# Patient Record
Sex: Female | Born: 1980 | Race: White | Hispanic: No | Marital: Single | Smoking: Former smoker
Health system: Southern US, Community
[De-identification: ages and names within clinical notes are randomized; demographics above are authoritative.]

## PROBLEM LIST (undated history)

## (undated) DIAGNOSIS — E282 Polycystic ovarian syndrome: Secondary | ICD-10-CM

## (undated) DIAGNOSIS — E0590 Thyrotoxicosis, unspecified without thyrotoxic crisis or storm: Secondary | ICD-10-CM

## (undated) DIAGNOSIS — K219 Gastro-esophageal reflux disease without esophagitis: Secondary | ICD-10-CM

## (undated) DIAGNOSIS — R569 Unspecified convulsions: Secondary | ICD-10-CM

## (undated) DIAGNOSIS — E079 Disorder of thyroid, unspecified: Secondary | ICD-10-CM

## (undated) DIAGNOSIS — R51 Headache: Secondary | ICD-10-CM

## (undated) DIAGNOSIS — F419 Anxiety disorder, unspecified: Secondary | ICD-10-CM

## (undated) HISTORY — DX: Anxiety disorder, unspecified: F41.9

## (undated) HISTORY — DX: Polycystic ovarian syndrome: E28.2

---

## 2001-01-20 ENCOUNTER — Emergency Department (HOSPITAL_COMMUNITY): Admission: EM | Admit: 2001-01-20 | Discharge: 2001-01-20 | Payer: Self-pay | Admitting: *Deleted

## 2001-02-15 HISTORY — PX: KNEE SURGERY: SHX244

## 2001-02-19 ENCOUNTER — Emergency Department (HOSPITAL_COMMUNITY): Admission: EM | Admit: 2001-02-19 | Discharge: 2001-02-19 | Payer: Self-pay | Admitting: Emergency Medicine

## 2001-02-19 ENCOUNTER — Encounter: Payer: Self-pay | Admitting: Emergency Medicine

## 2002-08-15 ENCOUNTER — Emergency Department (HOSPITAL_COMMUNITY): Admission: EM | Admit: 2002-08-15 | Discharge: 2002-08-16 | Payer: Self-pay | Admitting: Emergency Medicine

## 2002-08-16 ENCOUNTER — Encounter: Payer: Self-pay | Admitting: Emergency Medicine

## 2006-10-07 ENCOUNTER — Emergency Department (HOSPITAL_COMMUNITY): Admission: EM | Admit: 2006-10-07 | Discharge: 2006-10-08 | Payer: Self-pay | Admitting: Emergency Medicine

## 2007-03-17 ENCOUNTER — Emergency Department (HOSPITAL_COMMUNITY): Admission: EM | Admit: 2007-03-17 | Discharge: 2007-03-17 | Payer: Self-pay | Admitting: Emergency Medicine

## 2010-08-14 ENCOUNTER — Emergency Department (HOSPITAL_COMMUNITY)
Admission: EM | Admit: 2010-08-14 | Discharge: 2010-08-15 | Disposition: A | Discharge status: Home / Self Care | Payer: Self-pay | Attending: Emergency Medicine | Admitting: Emergency Medicine

## 2010-08-14 DIAGNOSIS — F411 Generalized anxiety disorder: Secondary | ICD-10-CM | POA: Insufficient documentation

## 2010-08-14 DIAGNOSIS — R0682 Tachypnea, not elsewhere classified: Secondary | ICD-10-CM | POA: Insufficient documentation

## 2010-08-14 DIAGNOSIS — R000 Tachycardia, unspecified: Secondary | ICD-10-CM | POA: Insufficient documentation

## 2010-08-14 DIAGNOSIS — F1010 Alcohol abuse, uncomplicated: Secondary | ICD-10-CM | POA: Insufficient documentation

## 2010-08-15 LAB — URINALYSIS, ROUTINE W REFLEX MICROSCOPIC
Bilirubin Urine: NEGATIVE
Glucose, UA: NEGATIVE mg/dL
Leukocytes, UA: NEGATIVE
Nitrite: POSITIVE — AB
Protein, ur: NEGATIVE mg/dL
Specific Gravity, Urine: 1.02 (ref 1.005–1.030)
Urobilinogen, UA: 0.2 mg/dL (ref 0.0–1.0)
pH: 5.5 (ref 5.0–8.0)

## 2010-08-15 LAB — DIFFERENTIAL
Basophils Absolute: 0.1 10*3/uL (ref 0.0–0.1)
Basophils Relative: 1 % (ref 0–1)
Eosinophils Absolute: 0 10*3/uL (ref 0.0–0.7)
Eosinophils Relative: 0 % (ref 0–5)
Lymphocytes Relative: 28 % (ref 12–46)
Lymphs Abs: 2.9 10*3/uL (ref 0.7–4.0)
Monocytes Absolute: 0.5 10*3/uL (ref 0.1–1.0)
Monocytes Relative: 5 % (ref 3–12)
Neutro Abs: 7 10*3/uL (ref 1.7–7.7)
Neutrophils Relative %: 67 % (ref 43–77)

## 2010-08-15 LAB — POCT I-STAT, CHEM 8
BUN: 11 mg/dL (ref 6–23)
Calcium, Ion: 1.01 mmol/L — ABNORMAL LOW (ref 1.12–1.32)
Chloride: 109 mEq/L (ref 96–112)
Creatinine, Ser: 0.8 mg/dL (ref 0.50–1.10)
Glucose, Bld: 107 mg/dL — ABNORMAL HIGH (ref 70–99)
HCT: 39 % (ref 36.0–46.0)
Hemoglobin: 13.3 g/dL (ref 12.0–15.0)
Potassium: 3.3 mEq/L — ABNORMAL LOW (ref 3.5–5.1)
Sodium: 140 mEq/L (ref 135–145)
TCO2: 15 mmol/L (ref 0–100)

## 2010-08-15 LAB — RAPID URINE DRUG SCREEN, HOSP PERFORMED
Amphetamines: NOT DETECTED
Barbiturates: NOT DETECTED
Benzodiazepines: NOT DETECTED
Cocaine: NOT DETECTED
Opiates: NOT DETECTED
Tetrahydrocannabinol: NOT DETECTED

## 2010-08-15 LAB — CBC
HCT: 36.8 % (ref 36.0–46.0)
Hemoglobin: 12.7 g/dL (ref 12.0–15.0)
MCH: 29.2 pg (ref 26.0–34.0)
MCHC: 34.5 g/dL (ref 30.0–36.0)
MCV: 84.6 fL (ref 78.0–100.0)
Platelets: 431 10*3/uL — ABNORMAL HIGH (ref 150–400)
RBC: 4.35 MIL/uL (ref 3.87–5.11)
RDW: 13.3 % (ref 11.5–15.5)
WBC: 10.5 10*3/uL (ref 4.0–10.5)

## 2010-08-15 LAB — URINE MICROSCOPIC-ADD ON

## 2010-08-15 LAB — POCT PREGNANCY, URINE: Preg Test, Ur: NEGATIVE

## 2010-08-15 LAB — ETHANOL: Alcohol, Ethyl (B): 190 mg/dL — ABNORMAL HIGH (ref 0–11)

## 2010-08-15 LAB — ACETAMINOPHEN LEVEL: Acetaminophen (Tylenol), Serum: <15 ug/mL (ref 10–30)

## 2010-08-15 LAB — SALICYLATE LEVEL: Salicylate Lvl: <2 mg/dL — ABNORMAL LOW (ref 2.8–20.0)

## 2010-11-05 LAB — URINALYSIS, ROUTINE W REFLEX MICROSCOPIC
Bilirubin Urine: NEGATIVE
Glucose, UA: NEGATIVE
Ketones, ur: NEGATIVE
Nitrite: NEGATIVE
Protein, ur: NEGATIVE
Specific Gravity, Urine: 1.026
Urobilinogen, UA: 1
pH: 5.5

## 2010-11-05 LAB — I-STAT 8, (EC8 V) (CONVERTED LAB)
Acid-Base Excess: 3 — ABNORMAL HIGH
BUN: 16
Bicarbonate: 27 — ABNORMAL HIGH
Chloride: 107
Glucose, Bld: 94
HCT: 45
Hemoglobin: 15.3 — ABNORMAL HIGH
Operator id: 272551
Potassium: 3.8
Sodium: 141
TCO2: 28
pCO2, Ven: 39.6 — ABNORMAL LOW
pH, Ven: 7.441 — ABNORMAL HIGH

## 2010-11-05 LAB — URINE CULTURE: Colony Count: 35000

## 2010-11-05 LAB — HEPATIC FUNCTION PANEL
ALT: 19
AST: 18
Albumin: 4.1
Alkaline Phosphatase: 62
Bilirubin, Direct: <0.1
Total Bilirubin: 0.4
Total Protein: 7.6

## 2010-11-05 LAB — LIPASE, BLOOD: Lipase: 30

## 2010-11-05 LAB — POCT PREGNANCY, URINE
Operator id: 272551
Preg Test, Ur: NEGATIVE

## 2010-11-05 LAB — POCT I-STAT CREATININE
Creatinine, Ser: 0.9
Operator id: 272551

## 2010-11-05 LAB — URINE MICROSCOPIC-ADD ON

## 2010-11-27 LAB — CBC
HCT: 41.7
Hemoglobin: 14.5
MCHC: 34.8
MCV: 85.5
Platelets: 371
RBC: 4.88
RDW: 13.3
WBC: 14.4 — ABNORMAL HIGH

## 2010-11-27 LAB — COMPREHENSIVE METABOLIC PANEL
ALT: 24
AST: 22
Albumin: 4.1
Alkaline Phosphatase: 63
BUN: 12
CO2: 25
Calcium: 9.5
Chloride: 105
Creatinine, Ser: 0.72
GFR calc Af Amer: >60
GFR calc non Af Amer: >60
Glucose, Bld: 86
Potassium: 4.1
Sodium: 137
Total Bilirubin: 0.7
Total Protein: 7.7

## 2010-11-27 LAB — RAPID URINE DRUG SCREEN, HOSP PERFORMED
Amphetamines: NOT DETECTED
Barbiturates: NOT DETECTED
Benzodiazepines: NOT DETECTED
Cocaine: NOT DETECTED
Opiates: NOT DETECTED
Tetrahydrocannabinol: NOT DETECTED

## 2010-11-27 LAB — DIFFERENTIAL
Basophils Absolute: 0.2 — ABNORMAL HIGH
Basophils Relative: 1
Eosinophils Absolute: 0.2
Eosinophils Relative: 2
Lymphocytes Relative: 21
Lymphs Abs: 3
Monocytes Absolute: 0.6
Monocytes Relative: 4
Neutro Abs: 10.4 — ABNORMAL HIGH
Neutrophils Relative %: 72

## 2011-12-24 ENCOUNTER — Emergency Department (HOSPITAL_COMMUNITY)
Admission: EM | Admit: 2011-12-24 | Discharge: 2011-12-24 | Disposition: A | Discharge status: Home / Self Care | Payer: No Typology Code available for payment source | Attending: Emergency Medicine | Admitting: Emergency Medicine

## 2011-12-24 ENCOUNTER — Emergency Department (HOSPITAL_COMMUNITY): Payer: No Typology Code available for payment source

## 2011-12-24 ENCOUNTER — Encounter (HOSPITAL_COMMUNITY): Payer: Self-pay | Admitting: *Deleted

## 2011-12-24 DIAGNOSIS — S199XXA Unspecified injury of neck, initial encounter: Secondary | ICD-10-CM | POA: Insufficient documentation

## 2011-12-24 DIAGNOSIS — Y929 Unspecified place or not applicable: Secondary | ICD-10-CM | POA: Insufficient documentation

## 2011-12-24 DIAGNOSIS — F17200 Nicotine dependence, unspecified, uncomplicated: Secondary | ICD-10-CM | POA: Insufficient documentation

## 2011-12-24 DIAGNOSIS — R209 Unspecified disturbances of skin sensation: Secondary | ICD-10-CM | POA: Insufficient documentation

## 2011-12-24 DIAGNOSIS — S0993XA Unspecified injury of face, initial encounter: Secondary | ICD-10-CM | POA: Insufficient documentation

## 2011-12-24 DIAGNOSIS — Y9389 Activity, other specified: Secondary | ICD-10-CM | POA: Insufficient documentation

## 2011-12-24 HISTORY — DX: Unspecified convulsions: R56.9

## 2011-12-24 LAB — POCT PREGNANCY, URINE: Preg Test, Ur: NEGATIVE

## 2011-12-24 MED ORDER — IBUPROFEN 800 MG PO TABS
800.0000 mg | ORAL_TABLET | Freq: Once | ORAL | Status: AC
  Administered 2011-12-24: 800 mg via ORAL
  Filled 2011-12-24: qty 1

## 2011-12-24 MED ORDER — DIAZEPAM 5 MG PO TABS: 5.0000 mg | ORAL_TABLET | Freq: Two times a day (BID) | ORAL | Status: DC

## 2011-12-24 MED ORDER — HYDROCODONE-ACETAMINOPHEN 5-325 MG PO TABS: 2.0000 | ORAL_TABLET | Freq: Four times a day (QID) | ORAL | Status: DC | PRN
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
## 2011-12-24 NOTE — ED Notes (Signed)
Pt resting on stretcher, C-Collar remains in place.
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
## 2011-12-28 NOTE — ED Provider Notes (Signed)
Medical screening examination/treatment/procedure(s) were performed by non-physician practitioner and as supervising physician I was immediately available for consultation/collaboration.  Raeford Razor, MD 12/28/11 2342

## 2012-05-21 ENCOUNTER — Ambulatory Visit (INDEPENDENT_AMBULATORY_CARE_PROVIDER_SITE_OTHER): Payer: PRIVATE HEALTH INSURANCE | Admitting: Family Medicine

## 2012-05-21 VITALS — BP 138/88 | HR 103 | Temp 98.4°F | Resp 16 | Ht 64.0 in | Wt 248.0 lb

## 2012-05-21 DIAGNOSIS — M25559 Pain in unspecified hip: Secondary | ICD-10-CM

## 2012-05-21 DIAGNOSIS — N939 Abnormal uterine and vaginal bleeding, unspecified: Secondary | ICD-10-CM

## 2012-05-21 DIAGNOSIS — N898 Other specified noninflammatory disorders of vagina: Secondary | ICD-10-CM

## 2012-05-21 LAB — POCT CBC
Granulocyte percent: 55.8 %G (ref 37–80)
HCT, POC: 43.6 % (ref 37.7–47.9)
Hemoglobin: 13.9 g/dL (ref 12.2–16.2)
Lymph, poc: 4.4 — AB (ref 0.6–3.4)
MCH, POC: 27.1 pg (ref 27–31.2)
MCHC: 31.9 g/dL (ref 31.8–35.4)
MCV: 85.1 fL (ref 80–97)
MID (cbc): 0.9 (ref 0–0.9)
MPV: 9.3 fL (ref 0–99.8)
POC Granulocyte: 6.7 (ref 2–6.9)
POC LYMPH PERCENT: 36.5 %L (ref 10–50)
POC MID %: 7.7 %M (ref 0–12)
Platelet Count, POC: 479 10*3/uL — AB (ref 142–424)
RBC: 5.12 M/uL (ref 4.04–5.48)
RDW, POC: 14.4 %
WBC: 12 10*3/uL — AB (ref 4.6–10.2)

## 2012-05-21 LAB — POCT URINE PREGNANCY: Preg Test, Ur: NEGATIVE

## 2012-05-21 LAB — GLUCOSE, POCT (MANUAL RESULT ENTRY): POC Glucose: 87 mg/dl (ref 70–99)

## 2012-05-21 MED ORDER — MEDROXYPROGESTERONE ACETATE 10 MG PO TABS: 10.0000 mg | ORAL_TABLET | Freq: Every day | ORAL | Status: DC

## 2012-05-21 MED ORDER — HYDROCODONE-IBUPROFEN 7.5-200 MG PO TABS: 1.0000 | ORAL_TABLET | Freq: Every evening | ORAL | Status: DC | PRN

## 2012-05-21 NOTE — Patient Instructions (Addendum)
Polycystic Ovarian Syndrome Polycystic ovarian syndrome is a condition with a number of problems. One problem is with the ovaries. The ovaries are organs located in the female pelvis, on each side of the uterus. Usually, during the menstrual cycle, an egg is released from 1 ovary every month. This is called ovulation. When the egg is fertilized, it goes into the womb (uterus), which allows for the growth of a baby. The egg travels from the ovary through the fallopian tube to the uterus. The ovaries also make the hormones estrogen and progesterone. These hormones help the development of a woman's breasts, body shape, and body hair. They also regulate the menstrual cycle and pregnancy. Sometimes, cysts form in the ovaries. A cyst is a fluid-filled sac. On the ovary, different types of cysts can form. The most common type of ovarian cyst is called a functional or ovulation cyst. It is normal, and often forms during the normal menstrual cycle. Each month, a woman's ovaries grow tiny cysts that hold the eggs. When an egg is fully grown, the sac breaks open. This releases the egg. Then, the sac which released the egg from the ovary dissolves. In one type of functional cyst, called a follicle cyst, the sac does not break open to release the egg. It may actually continue to grow. This type of cyst usually disappears within 1 to 3 months.  One type of cyst problem with the ovaries is called Polycystic Ovarian Syndrome (PCOS). In this condition, many follicle cysts form, but do not rupture and produce an egg. This health problem can affect the following:  Menstrual cycle.  Heart.  Obesity.  Cancer of the uterus.  Fertility.  Blood vessels.  Hair growth (face and body) or baldness.  Hormones.  Appearance.  High blood pressure.  Stroke.  Insulin production.  Inflammation of the liver.  Elevated blood cholesterol and triglycerides. CAUSES   No one knows the exact cause of PCOS.  Women with  PCOS often have a mother or sister with PCOS. There is not yet enough proof to say this is inherited.  Many women with PCOS have a weight problem.  Researchers are looking at the relationship between PCOS and the body's ability to make insulin. Insulin is a hormone that regulates the change of sugar, starches, and other food into energy for the body's use, or for storage. Some women with PCOS make too much insulin. It is possible that the ovaries react by making too many female hormones, called androgens. This can lead to acne, excessive hair growth, weight gain, and ovulation problems.  Too much production of luteinizing hormone (LH) from the pituitary gland in the brain stimulates the ovary to produce too much female hormone (androgen). SYMPTOMS   Infrequent or no menstrual periods, and/or irregular bleeding.  Inability to get pregnant (infertility), because of not ovulating.  Increased growth of hair on the face, chest, stomach, back, thumbs, thighs, or toes.  Acne, oily skin, or dandruff.  Pelvic pain.  Weight gain or obesity, usually carrying extra weight around the waist.  Type 2 diabetes (this is the diabetes that usually does not need insulin).  High cholesterol.  High blood pressure.  Female-pattern baldness or thinning hair.  Patches of thickened and dark brown or black skin on the neck, arms, breasts, or thighs.  Skin tags, or tiny excess flaps of skin, in the armpits or neck area.  Sleep apnea (excessive snoring and breathing stops at times while asleep).  Deepening of the voice.    Gestational diabetes when pregnant.  Increased risk of miscarriage with pregnancy. DIAGNOSIS  There is no single test to diagnose PCOS.   Your caregiver will:  Take a medical history.  Perform a pelvic exam.  Perform an ultrasound.  Check your female and female hormone levels.  Measure glucose or sugar levels in the blood.  Do other blood tests.  If you are producing too many  female hormones, your caregiver will make sure it is from PCOS. At the physical exam, your caregiver will want to evaluate the areas of increased hair growth. Try to allow natural hair growth for a few days before the visit.  During a pelvic exam, the ovaries may be enlarged or swollen by the increased number of small cysts. This can be seen more easily by vaginal ultrasound or screening, to examine the ovaries and lining of the uterus (endometrium) for cysts. The uterine lining may become thicker, if there has not been a regular period. TREATMENT  Because there is no cure for PCOS, it needs to be managed to prevent problems. Treatments are based on your symptoms. Treatment is also based on whether you want to have a baby or whether you need contraception.  Treatment may include:  Progesterone hormone, to start a menstrual period.  Birth control pills, to make you have regular menstrual periods.  Medicines to make you ovulate, if you want to get pregnant.  Medicines to control your insulin.  Medicine to control your blood pressure.  Medicine and diet, to control your high cholesterol and triglycerides in your blood.  Surgery, making small holes in the ovary, to decrease the amount of female hormone production. This is done through a long, lighted tube (laparoscope), placed into the pelvis through a tiny incision in the lower abdomen. Your caregiver will go over some of the choices with you. WOMEN WITH PCOS HAVE THESE CHARACTERISTICS:  High levels of female hormones called androgens.  An irregular or no menstrual cycle.  May have many small cysts in their ovaries. PCOS is the most common hormonal reproductive problem in women of childbearing age. WHY DO WOMEN WITH PCOS HAVE TROUBLE WITH THEIR MENSTRUAL CYCLE? Each month, about 20 eggs start to mature in the ovaries. As one egg grows and matures, the follicle breaks open to release the egg, so it can travel through the fallopian tube for  fertilization. When the single egg leaves the follicle, ovulation takes place. In women with PCOS, the ovary does not make all of the hormones it needs for any of the eggs to fully mature. They may start to grow and accumulate fluid, but no one egg becomes large enough. Instead, some may remain as cysts. Since no egg matures or is released, ovulation does not occur and the hormone progesterone is not made. Without progesterone, a woman's menstrual cycle is irregular or absent. Also, the cysts produce female hormones, which continue to prevent ovulation.  Document Released: 05/28/2004 Document Revised: 04/26/2011 Document Reviewed: 12/20/2008 [employer] Patient Information 2013 [employer].  

## 2012-05-21 NOTE — Progress Notes (Signed)
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  She has had heavy bleeding since Thursday (3 days ago) in the context of oligomenorrhea.  LMP before this was spotting 2 months ago.  She has gained a whole lot of weight in the past few months.  Also she has been noticing facial hair growth which is disconcerting.  Passing clots and changing pads q2hours.  Notes headache and cramps.  Objective:  Seen with aunt in room Abdomen:  Soft, mild lower abdominal tenderness Vagina:  No active bleeding, cervix closed, scant blood in vault, mildly tender bimanual exam with no swelling of utx, adnexal masses, or cervical motion tenderness. Results for orders placed in visit on 05/21/12  POCT CBC      Result Value Range   WBC 12.0 (*) 4.6 - 10.2 K/uL   Lymph, poc 4.4 (*) 0.6 - 3.4   POC LYMPH PERCENT 36.5  10 - 50 %L   MID (cbc) 0.9  0 - 0.9   POC MID % 7.7  0 - 12 %M   POC Granulocyte 6.7  2 - 6.9   Granulocyte percent 55.8  37 - 80 %G   RBC 5.12  4.04 - 5.48 M/uL   Hemoglobin 13.9  12.2 - 16.2 g/dL   HCT, POC 16.1  09.6 - 47.9 %   MCV 85.1  80 - 97 fL   MCH, POC 27.1  27 - 31.2 pg   MCHC 31.9  31.8 - 35.4 g/dL   RDW, POC 04.5     Platelet Count, POC 479 (*) 142 - 424 K/uL   MPV 9.3  0 - 99.8 fL  GLUCOSE, POCT (MANUAL RESULT ENTRY)      Result Value Range   POC Glucose 87  70 - 99 mg/dl  Urine HCG:  Negative  Assessment:  Menorrhagia in context of increasing facial hair, obesity and oligomenorrhea.  This suggests that patient may have PCOS and be a candidate for metformin.  She seems a bit depressed and distant, which may need to be addressed by her PCP.    Vaginal bleeding - Plan: POCT CBC, POCT urine pregnancy, POCT glucose (manual entry), TSH, GC/Chlamydia Probe Amp, Ambulatory referral to Gynecology, medroxyPROGESTERone (PROVERA) 10 MG tablet  Pain in joint, pelvic region and thigh, unspecified laterality - Plan: HYDROcodone-ibuprofen (VICOPROFEN) 7.5-200 MG per tablet

## 2012-05-22 ENCOUNTER — Other Ambulatory Visit: Payer: Self-pay | Admitting: Family Medicine

## 2012-05-22 DIAGNOSIS — R7989 Other specified abnormal findings of blood chemistry: Secondary | ICD-10-CM

## 2012-05-22 LAB — TSH: TSH: 0.011 u[IU]/mL — ABNORMAL LOW (ref 0.350–4.500)

## 2012-05-23 LAB — GC/CHLAMYDIA PROBE AMP
CT Probe RNA: NEGATIVE
GC Probe RNA: NEGATIVE

## 2012-08-01 ENCOUNTER — Other Ambulatory Visit (HOSPITAL_COMMUNITY): Payer: Self-pay | Admitting: Endocrinology

## 2012-08-01 DIAGNOSIS — E0590 Thyrotoxicosis, unspecified without thyrotoxic crisis or storm: Secondary | ICD-10-CM

## 2012-08-15 ENCOUNTER — Other Ambulatory Visit (HOSPITAL_COMMUNITY): Payer: Self-pay | Admitting: Endocrinology

## 2012-08-15 ENCOUNTER — Encounter (HOSPITAL_COMMUNITY)
Admission: RE | Admit: 2012-08-15 | Discharge: 2012-08-15 | Disposition: A | Discharge status: Home / Self Care | Payer: PRIVATE HEALTH INSURANCE | Source: Ambulatory Visit | Attending: Endocrinology | Admitting: Endocrinology

## 2012-08-15 DIAGNOSIS — E0590 Thyrotoxicosis, unspecified without thyrotoxic crisis or storm: Secondary | ICD-10-CM

## 2012-08-16 ENCOUNTER — Encounter (HOSPITAL_COMMUNITY): Payer: Self-pay

## 2012-08-16 ENCOUNTER — Encounter (HOSPITAL_COMMUNITY)
Admission: RE | Admit: 2012-08-16 | Discharge: 2012-08-16 | Disposition: A | Discharge status: Home / Self Care | Payer: PRIVATE HEALTH INSURANCE | Source: Ambulatory Visit | Attending: Endocrinology | Admitting: Endocrinology

## 2012-08-16 DIAGNOSIS — E0590 Thyrotoxicosis, unspecified without thyrotoxic crisis or storm: Secondary | ICD-10-CM

## 2012-08-16 HISTORY — DX: Thyrotoxicosis, unspecified without thyrotoxic crisis or storm: E05.90

## 2012-08-16 MED ORDER — SODIUM IODIDE I 131 CAPSULE
16.4000 | Freq: Once | INTRAVENOUS | Status: AC | PRN
  Administered 2012-08-15: 16.4 via ORAL

## 2012-08-16 MED ORDER — SODIUM PERTECHNETATE TC 99M INJECTION
10.7000 | Freq: Once | INTRAVENOUS | Status: AC | PRN
  Administered 2012-08-16: 10.7 via INTRAVENOUS

## 2012-08-17 ENCOUNTER — Other Ambulatory Visit: Payer: Self-pay | Admitting: Endocrinology

## 2012-08-17 DIAGNOSIS — E0590 Thyrotoxicosis, unspecified without thyrotoxic crisis or storm: Secondary | ICD-10-CM

## 2012-08-24 ENCOUNTER — Ambulatory Visit
Admission: RE | Admit: 2012-08-24 | Discharge: 2012-08-24 | Disposition: A | Discharge status: Home / Self Care | Payer: PRIVATE HEALTH INSURANCE | Source: Ambulatory Visit | Attending: Endocrinology | Admitting: Endocrinology

## 2012-08-24 DIAGNOSIS — E0590 Thyrotoxicosis, unspecified without thyrotoxic crisis or storm: Secondary | ICD-10-CM

## 2012-09-28 ENCOUNTER — Other Ambulatory Visit: Payer: Self-pay | Admitting: Endocrinology

## 2012-09-28 DIAGNOSIS — E0590 Thyrotoxicosis, unspecified without thyrotoxic crisis or storm: Secondary | ICD-10-CM

## 2012-10-06 ENCOUNTER — Encounter (HOSPITAL_COMMUNITY)
Admission: RE | Admit: 2012-10-06 | Discharge: 2012-10-06 | Disposition: A | Discharge status: Home / Self Care | Payer: PRIVATE HEALTH INSURANCE | Source: Ambulatory Visit | Attending: Endocrinology | Admitting: Endocrinology

## 2012-10-06 DIAGNOSIS — E0590 Thyrotoxicosis, unspecified without thyrotoxic crisis or storm: Secondary | ICD-10-CM

## 2012-10-06 DIAGNOSIS — E0520 Thyrotoxicosis with toxic multinodular goiter without thyrotoxic crisis or storm: Secondary | ICD-10-CM | POA: Insufficient documentation

## 2012-10-06 LAB — HCG, SERUM, QUALITATIVE: Preg, Serum: NEGATIVE

## 2012-10-06 MED ORDER — SODIUM IODIDE I 131 CAPSULE
36.6000 | Freq: Once | INTRAVENOUS | Status: AC | PRN
  Administered 2012-10-06: 36.6 via ORAL

## 2012-11-07 ENCOUNTER — Other Ambulatory Visit: Payer: Self-pay

## 2012-11-07 ENCOUNTER — Ambulatory Visit (INDEPENDENT_AMBULATORY_CARE_PROVIDER_SITE_OTHER): Payer: PRIVATE HEALTH INSURANCE | Admitting: Family Medicine

## 2012-11-07 VITALS — BP 132/82 | HR 112 | Temp 100.5°F | Resp 17 | Ht 64.0 in | Wt 241.0 lb

## 2012-11-07 DIAGNOSIS — R509 Fever, unspecified: Secondary | ICD-10-CM

## 2012-11-07 DIAGNOSIS — J039 Acute tonsillitis, unspecified: Secondary | ICD-10-CM

## 2012-11-07 LAB — POCT CBC
Granulocyte percent: 87.3 %G — AB (ref 37–80)
HCT, POC: 40.4 % (ref 37.7–47.9)
Hemoglobin: 13 g/dL (ref 12.2–16.2)
Lymph, poc: 1.6 (ref 0.6–3.4)
MCH, POC: 28.8 pg (ref 27–31.2)
MCHC: 32.2 g/dL (ref 31.8–35.4)
MCV: 89.3 fL (ref 80–97)
MID (cbc): 0.8 (ref 0–0.9)
MPV: 9.1 fL (ref 0–99.8)
POC Granulocyte: 16.8 — AB (ref 2–6.9)
POC LYMPH PERCENT: 8.3 %L — AB (ref 10–50)
POC MID %: 4.4 %M (ref 0–12)
Platelet Count, POC: 321 10*3/uL (ref 142–424)
RBC: 4.52 M/uL (ref 4.04–5.48)
RDW, POC: 14.1 %
WBC: 19.3 10*3/uL — AB (ref 4.6–10.2)

## 2012-11-07 LAB — POCT RAPID STREP A (OFFICE): Rapid Strep A Screen: NEGATIVE

## 2012-11-07 MED ORDER — AMOXICILLIN-POT CLAVULANATE 875-125 MG PO TABS: 1.0000 | ORAL_TABLET | Freq: Two times a day (BID) | ORAL | Status: DC

## 2012-11-07 NOTE — Patient Instructions (Addendum)
1.  Take Ibuprofen 200mg   Four tablets every 8 hours for fever, sore throat. 2.  Take Tylenol two tablets four hours after Ibuprofen.  Tonsillitis Tonsils are lumps of lymphoid tissues at the back of the throat. Each tonsil has 20 crevices (crypts). Tonsils help fight nose and throat infections and keep infection from spreading to other parts of the body for the first 18 months of life. Tonsillitis is an infection of the throat that causes the tonsils to become red, tender, and swollen. CAUSES Sudden and, if treated, temporary (acute) tonsillitis is usually caused by infection with streptococcal bacteria. Long lasting (chronic) tonsillitis occurs when the crypts of the tonsils become filled with pieces of food and bacteria, which makes it easy for the tonsils to become constantly infected. SYMPTOMS  Symptoms of tonsillitis include:  A sore throat.  White patches on the tonsils.  Fever.  Tiredness. DIAGNOSIS Tonsillitis can be diagnosed through a physical exam. Diagnosis can be confirmed with the results of lab tests, including a throat culture. TREATMENT  The goals of tonsillitis treatment include the reduction of the severity and duration of symptoms, prevention of associated conditions, and prevention of disease transmission. Tonsillitis caused by bacteria can be treated with antibiotics. Usually, treatment with antibiotics is started before the cause of the tonsillitis is known. However, if it is determined that the cause is not bacterial, antibiotics will not treat the tonsillitis. If attacks of tonsillitis are severe and frequent, your caregiver may recommend surgery to remove the tonsils (tonsillectomy). HOME CARE INSTRUCTIONS   Rest as much as possible and get plenty of sleep.  Drink plenty of fluids. While the throat is very sore, eat soft foods or liquids, such as sherbet, soups, or instant breakfast drinks.  Eat frozen ice pops.  Older children and adults may gargle with a warm  or cold liquid to help soothe the throat. Mix 1 teaspoon of salt in 1 cup of water.  Other family members who also develop a sore throat or fever should have a medical exam or throat culture.  Only take over-the-counter or prescription medicines for pain, discomfort, or fever as directed by your caregiver.  If you are given antibiotics, take them as directed. Finish them even if you start to feel better. SEEK MEDICAL CARE IF:   Your baby is older than 3 months with a rectal temperature of 100.5 F (38.1 C) or higher for more than 1 day.  Large, tender lumps develop in your neck.  A rash develops.  Green, yellow-brown, or bloody substance is coughed up.  You are unable to swallow liquids or food for 24 hours.  Your child is unable to swallow food or liquids for 12 hours. SEEK IMMEDIATE MEDICAL CARE IF:   You develop any new symptoms such as vomiting, severe headache, stiff neck, chest pain, or trouble breathing or swallowing.  You have severe throat pain along with drooling or voice changes.  You have severe pain, unrelieved with recommended medications.  You are unable to fully open the mouth.  You develop redness, swelling, or severe pain anywhere in the neck.  You have a fever.  Your baby is older than 3 months with a rectal temperature of 102 F (38.9 C) or higher.  Your baby is 31 months old or younger with a rectal temperature of 100.4 F (38 C) or higher. MAKE SURE YOU:   Understand these instructions.  Will watch your condition.  Will get help right away if you are not doing  well or get worse. Document Released: 11/11/2004 Document Revised: 04/26/2011 Document Reviewed: 04/09/2010 [facility] Patient Information 2014 [place], [place].

## 2012-11-07 NOTE — Progress Notes (Signed)
[address]   [place], [place]  [address]   [phone]  Subjective:    Patient ID: Chelsey Walker, female    DOB: September 09, 1980, 32 y.o.   MRN: [id number]  Sore Throat  Associated symptoms include ear pain and trouble swallowing. Pertinent negatives include no congestion, coughing, diarrhea, shortness of breath, stridor or vomiting.   This 32 y.o. female presents for evaluation of sore throat, laryngitis.  Presenting with aunt.  Six weeks ago, did radioactive iodine ablation; three weeks later, had reaction; her throat and tonsils were so swollen they touched uvula.  Unable to eat or talk.  Diagnosed with reaction by Dr. Lurene Shadow.  Prescribed Prednisone tapered dose.  Also prescribed antibiotic and completed medication.  Started feeling better.  Now with similar symptoms; throat not quite as severe as before.  Called [facility] and advised that too far out to be a reaction; recommended evaluation by family physician.  Onset last night.  +fever Tmax unknown; +chills/sweats. +HA.  L ear pain.  +STdiffuse; pain with swallowing; pain with talking; no pain with opening mouth.  No rhinorrhea; no nasal congestion; no cough.  Sneezing causes horrible pain.  PCP:  none   Review of Systems  Constitutional: Positive for fever, chills and diaphoresis.  HENT: Positive for ear pain, sore throat, trouble swallowing and voice change. Negative for congestion, rhinorrhea, sneezing and postnasal drip.   Respiratory: Negative for cough, shortness of breath, wheezing and stridor.   Gastrointestinal: Negative for nausea, vomiting and diarrhea.    Past Medical History  Diagnosis Date  . Seizures   . Anxiety   . Hyperthyroidism   . PCOS (polycystic ovarian syndrome)     Past Surgical History  Procedure Laterality Date  . Knee surgery  2003    Prior to Admission medications   Medication Sig Start Date End Date Taking? Authorizing Provider  diazepam (VALIUM) 5 MG tablet Take 5 mg by mouth every 6 (six) hours  as needed for anxiety.   Yes Historical Provider, MD  HYDROcodone-ibuprofen (VICOPROFEN) 7.5-200 MG per tablet Take 1 tablet by mouth every 8 (eight) hours as needed for pain.   Yes Historical Provider, MD  methimazole (TAPAZOLE) 10 MG tablet Take 10 mg by mouth 3 (three) times daily.   Yes Historical Provider, MD  metoprolol succinate (TOPROL-XL) 50 MG 24 hr tablet Take 50 mg by mouth daily. Take with or immediately following a meal.   Yes Historical Provider, MD  norgestrel-ethinyl estradiol (LOW-OGESTREL) 0.3-30 MG-MCG tablet Take 1 tablet by mouth daily.   Yes Historical Provider, MD  Progesterone 100 MG SUPP Place vaginally.   Yes Historical Provider, MD  Vitamin D, Ergocalciferol, (DRISDOL) 50000 UNITS CAPS capsule Take 1,000 Units by mouth.   Yes Historical Provider, MD    No Known Allergies  History   Social History  . Marital Status: Single    Spouse Name: N/A    Number of Children: N/A  . Years of Education: N/A   Occupational History  . Not on file.   Social History Main Topics  . Smoking status: Current Every Day Smoker -- 0.50 packs/day    Types: Cigarettes  . Smokeless tobacco: Not on file  . Alcohol Use: Yes     Comment: occasionally  . Drug Use: No  . Sexual Activity: Yes    Birth Control/ Protection: None   Other Topics Concern  . Not on file   Social History Narrative  . No narrative on file  Family History  Problem Relation Age of Onset  . Seizures Mother   . Mental illness Mother   . Diabetes Father   . Hypertension Father   . Mental retardation Sister   . Diabetes Maternal Grandmother   . Diabetes Paternal Grandmother   . Hypertension Paternal Grandmother        Objective:   Physical Exam  Nursing note and vitals reviewed. Constitutional: She is oriented to person, place, and time. She appears well-developed and well-nourished. She appears ill. No distress.  HENT:  Head: Normocephalic and atraumatic.  Right Ear: External ear normal.    Left Ear: External ear normal.  Nose: Nose normal.  Mouth/Throat: Mucous membranes are normal. Oropharyngeal exudate and posterior oropharyngeal erythema present. No tonsillar abscesses.  Hypertrophied tonsils B with exudate.  Eyes: Conjunctivae and EOM are normal. Pupils are equal, round, and reactive to light.  Neck: Normal range of motion. Neck supple.  Cardiovascular: Normal rate, regular rhythm and normal heart sounds.   Rate 98.  Pulmonary/Chest: Effort normal and breath sounds normal. She has no wheezes. She has no rales.  Lymphadenopathy:    She has cervical adenopathy.  Neurological: She is alert and oriented to person, place, and time.  Skin: Skin is warm and dry. No rash noted. She is not diaphoretic.  Psychiatric: She has a normal mood and affect. Her behavior is normal.   Results for orders placed in visit on 11/07/12  POCT CBC      Result Value Range   WBC 19.3 (*) 4.6 - 10.2 K/uL   Lymph, poc 1.6  0.6 - 3.4   POC LYMPH PERCENT 8.3 (*) 10 - 50 %L   MID (cbc) 0.8  0 - 0.9   POC MID % 4.4  0 - 12 %M   POC Granulocyte 16.8 (*) 2 - 6.9   Granulocyte percent 87.3 (*) 37 - 80 %G   RBC 4.52  4.04 - 5.48 M/uL   Hemoglobin 13.0  12.2 - 16.2 g/dL   HCT, POC 60.4  54.0 - 47.9 %   MCV 89.3  80 - 97 fL   MCH, POC 28.8  27 - 31.2 pg   MCHC 32.2  31.8 - 35.4 g/dL   RDW, POC 98.1     Platelet Count, POC 321  142 - 424 K/uL   MPV 9.1  0 - 99.8 fL  POCT RAPID STREP A (OFFICE)      Result Value Range   Rapid Strep A Screen Negative  Negative       Assessment & Plan:  Acute tonsillitis - Plan: POCT CBC, POCT rapid strep A  Fever, unspecified - Plan: POCT CBC, POCT rapid strep A  1. Acute tonsillitis:  New.  Rx for Augmentin provided; continue Tylenol and Motrin PRN.  RTC inability to swallow.   Meds ordered this encounter  Medications  . norgestrel-ethinyl estradiol (LOW-OGESTREL) 0.3-30 MG-MCG tablet    Sig: Take 1 tablet by mouth daily.  . metoprolol succinate  (TOPROL-XL) 50 MG 24 hr tablet    Sig: Take 50 mg by mouth daily. Take with or immediately following a meal.  . methimazole (TAPAZOLE) 10 MG tablet    Sig: Take 10 mg by mouth 3 (three) times daily.  . Vitamin D, Ergocalciferol, (DRISDOL) 50000 UNITS CAPS capsule    Sig: Take 1,000 Units by mouth.  . Progesterone 100 MG SUPP    Sig: Place vaginally.  Marland Kitchen HYDROcodone-ibuprofen (VICOPROFEN) 7.5-200 MG per tablet  Sig: Take 1 tablet by mouth every 8 (eight) hours as needed for pain.  . diazepam (VALIUM) 5 MG tablet    Sig: Take 5 mg by mouth every 6 (six) hours as needed for anxiety.  Marland Kitchen amoxicillin-clavulanate (AUGMENTIN) 875-125 MG per tablet    Sig: Take 1 tablet by mouth 2 (two) times daily.    Dispense:  20 tablet    Refill:  0

## 2012-11-09 LAB — CULTURE, GROUP A STREP

## 2013-03-20 ENCOUNTER — Ambulatory Visit (INDEPENDENT_AMBULATORY_CARE_PROVIDER_SITE_OTHER): Payer: PRIVATE HEALTH INSURANCE | Admitting: Family Medicine

## 2013-03-20 VITALS — BP 140/90 | HR 84 | Temp 99.2°F | Resp 16 | Ht 64.0 in | Wt 241.0 lb

## 2013-03-20 DIAGNOSIS — J039 Acute tonsillitis, unspecified: Secondary | ICD-10-CM

## 2013-03-20 DIAGNOSIS — J0391 Acute recurrent tonsillitis, unspecified: Secondary | ICD-10-CM

## 2013-03-20 MED ORDER — AMOXICILLIN-POT CLAVULANATE 875-125 MG PO TABS: 1.0000 | ORAL_TABLET | Freq: Two times a day (BID) | ORAL | Status: DC

## 2013-03-20 NOTE — Progress Notes (Signed)
Subjective:  This chart was scribed for Elvina SidleKurt Lauenstein, MD by Carl Bestelina Holson, Medical Scribe. This patient was seen in Room 14 and the patient's care was started at 5:04 PM.   Patient ID: Chelsey Walker, female    DOB: 11/29/1980, 33 y.o.   MRN: [id number]  HPI HPI Comments: Chelsey Walker is a 33 y.o. female who presents to the [facility] complaining of constant sore throat that started yesterday.  The patient denies fever as an associated symptom.  She lists ear pain as an associated symptom.  The patient states that [facility] with the same symptoms and was diagnosed with tonsillitis.  She states that she was prescribed Augmentin.  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   Past Medical History  Diagnosis Date   Seizures    Anxiety    Hyperthyroidism    PCOS (polycystic ovarian syndrome)    Past Surgical History  Procedure Laterality Date   Knee surgery  2003   Family History  Problem Relation Age of Onset   Seizures Mother    Mental illness Mother    Diabetes Father    Hypertension Father    Mental retardation Sister    Diabetes Maternal Grandmother    Diabetes Paternal Grandmother    Hypertension Paternal Grandmother    History   Social History   Marital Status: Single    Spouse Name: N/A    Number of Children: N/A   Years of Education: N/A   Occupational History   Not on file.   Social History Main Topics   Smoking status: Former Smoker -- 0.50 packs/day    Types: Cigarettes   Smokeless tobacco: Not on file   Alcohol Use: Yes     Comment: occasionally   Drug Use: No   Sexual Activity: Yes    Birth Control/ Protection: None   Other Topics Concern   Not on file   Social History Narrative   No narrative on file   No Known Allergies   Review of Systems  Constitutional: Negative for fever.  HENT: Positive for ear pain and sore throat.   All other systems reviewed and  are negative.     Objective:  Physical Exam  Nursing note and vitals reviewed. Constitutional: She is oriented to person, place, and time. She appears well-developed and well-nourished. No distress.  HENT:  Head: Normocephalic and atraumatic.  Eyes: EOM are normal.  Neck: Neck supple.  Cardiovascular: Normal rate.   Musculoskeletal: Normal range of motion.  Neurological: She is alert and oriented to person, place, and time.  Skin: Skin is warm and dry.  Psychiatric: She has a normal mood and affect. Her behavior is normal.   Oropharynx is red with swollen tonsils. TMs are normal. Neck shows some increase anterior cervical adenopathy which is not suspicious for anything other than inflammatory nodes Neck is supple    BP 140/90   Pulse 84   Temp(Src) 99.2 F (37.3 C) (Oral)   Resp 16   Ht 5\' 4"  (1.626 m)   Wt 241 lb (109.317 kg)   BMI 41.35 kg/m2   SpO2 100%   LMP 03/15/2013 Assessment & Plan:    I personally performed the services described in this documentation, which was scribed in my presence. The recorded information has been reviewed and is accurate.  Recurrent tonsillitis - Plan: Culture, Group A Strep, amoxicillin-clavulanate (AUGMENTIN) 875-125 MG per tablet, Ambulatory referral  to ENT  Signed, Elvina Sidle, MD

## 2013-03-20 NOTE — Patient Instructions (Signed)

## 2013-03-22 LAB — CULTURE, GROUP A STREP: Organism ID, Bacteria: NORMAL

## 2013-05-26 ENCOUNTER — Ambulatory Visit (INDEPENDENT_AMBULATORY_CARE_PROVIDER_SITE_OTHER): Payer: PRIVATE HEALTH INSURANCE | Admitting: Emergency Medicine

## 2013-05-26 VITALS — BP 120/80 | HR 97 | Temp 98.7°F | Resp 18 | Ht 62.25 in | Wt 234.2 lb

## 2013-05-26 DIAGNOSIS — A088 Other specified intestinal infections: Secondary | ICD-10-CM

## 2013-05-26 MED ORDER — LOPERAMIDE HCL 2 MG PO TABS: ORAL_TABLET | ORAL | Status: DC

## 2013-05-26 MED ORDER — ONDANSETRON 8 MG PO TBDP: 8.0000 mg | ORAL_TABLET | Freq: Three times a day (TID) | ORAL | Status: DC | PRN

## 2013-05-26 NOTE — Patient Instructions (Signed)
Diet The clear liquid diet consists of foods that are liquid or will become liquid at room temperature. Examples of foods allowed on a clear liquid diet include fruit juice, broth or bouillon, gelatin, or frozen ice pops. You should be able to see through the liquid. The purpose of this diet is to provide the necessary fluids, electrolytes (such as sodium and potassium), and energy to keep the body functioning during times when you are not able to consume a regular diet. A clear liquid diet should not be continued for long periods of time, as it is not nutritionally adequate.  A CLEAR LIQUID DIET MAY BE NEEDED:  When a sudden-onset (acute) condition occurs before or after surgery.   As the first step in oral feeding.   For fluid and electrolyte replacement in diarrheal diseases.   As a diet before certain medical tests are performed.  ADEQUACY The clear liquid diet is adequate only in ascorbic acid, according to the Recommended Dietary Allowances of the National Research Council.  CHOOSING FOODS Breads and Starches  Allowed: None are allowed.   Avoid: All are to be avoided.  Vegetables  Allowed: Strained vegetable juices.   Avoid: Any others.  Fruit  Allowed: Strained fruit juices and fruit drinks. Include 1 serving of citrus or vitamin C-enriched fruit juice daily.   Avoid: Any others.  Meat and Meat Substitutes  Allowed: None are allowed.   Avoid: All are to be avoided.  Milk Products  Allowed: None are allowed.   Avoid: All are to be avoided.  Soups and Combination Foods  Allowed: Clear bouillon, broth, or strained broth-based soups.   Avoid: Any others.  Desserts and Sweets  Allowed: Sugar, honey. High-protein gelatin. Flavored gelatin, ices, or frozen ice pops that do not contain milk.   Avoid: Any others.  Fats and Oils  Allowed: None are allowed.   Avoid: All are to be avoided.  Beverages  Allowed: Cereal  beverages, coffee (regular or decaffeinated), tea, or soda at the discretion of your health care provider.   Avoid: Any others.  Condiments  Allowed: Salt.   Avoid: Any others, including pepper.  Supplements  Allowed: Liquid nutrition beverages that you can see through.   Avoid: Any others that contain lactose or fiber. SAMPLE MEAL PLAN Breakfast  4 oz (120 mL) strained orange juice.   to 1 cup (120 to 240 mL) gelatin (plain or fortified).  1 cup (240 mL) beverage (coffee or tea).  Sugar, if desired. Midmorning Snack   cup (120 mL) gelatin (plain or fortified). Lunch  1 cup (240 mL) broth or consomm.  4 oz (120 mL) strained grapefruit juice.   cup (120 mL) gelatin (plain or fortified).  1 cup (240 mL) beverage (coffee or tea).  Sugar, if desired. Midafternoon Snack   cup (120 mL) fruit ice.   cup (120 mL) strained fruit juice. Dinner  1 cup (240 mL) broth or consomm.   cup (120 mL) cranberry juice.   cup (120 mL) flavored gelatin (plain or fortified).  1 cup (240 mL) beverage (coffee or tea).  Sugar, if desired. Evening Snack  4 oz (120 mL) strained apple juice (vitamin C-fortified).   cup (120 mL) flavored gelatin (plain or fortified). MAKE SURE YOU:  Understand these instructions.  Will watch your child's condition.  Will get help right away if your child is not doing well or gets worse. Document Released: 02/01/2005 Document Revised: 10/04/2012 Document Reviewed: 07/04/2012 [employer] Patient Information 2014 [employer].  

## 2013-05-26 NOTE — Progress Notes (Signed)
[facility] [address] [place] [address] [phone]  Date:  05/26/2013   Name:  Chelsey Walker   DOB:  Nov 07, 1980   MRN:  [id number]  PCP:  No PCP Per Patient    Chief Complaint: Abdominal Pain, Nausea, Emesis and Diarrhea   History of Present Illness:  Chelsey Walker is a 33 y.o. very pleasant female patient who presents with the following:  Ill since sudden onset last night.  Had diarrhea and vomiting repeatedly.  No fever or chills.  Watery diarrhea.  The patient has no complaint of blood, mucous, or pus in her stools. Some abdominal cramping pain. No travel or sketchy water.  No ill contact.  Works in nursing home. No improvement with over the counter medications or other home remedies. Denies other complaint or health concern today.   There are no active problems to display for this patient.   Past Medical History  Diagnosis Date  . Seizures   . Anxiety   . Hyperthyroidism   . PCOS (polycystic ovarian syndrome)     Past Surgical History  Procedure Laterality Date  . Knee surgery  2003    History  Substance Use Topics  . Smoking status: Former Smoker -- 0.50 packs/day    Types: Cigarettes  . Smokeless tobacco: Not on file  . Alcohol Use: Yes     Comment: occasionally    Family History  Problem Relation Age of Onset  . Seizures Mother   . Mental illness Mother   . Diabetes Father   . Hypertension Father   . Mental retardation Sister   . Diabetes Maternal Grandmother   . Diabetes Paternal Grandmother   . Hypertension Paternal Grandmother     No Known Allergies  Medication list has been reviewed and updated.  Current Outpatient Prescriptions on File Prior to Visit  Medication Sig Dispense Refill  . levothyroxine (SYNTHROID, LEVOTHROID) 100 MCG tablet Take 100 mcg by mouth daily before breakfast.      . norgestrel-ethinyl estradiol (LOW-OGESTREL) 0.3-30 MG-MCG tablet Take 1 tablet by mouth daily.      . Vitamin D,  Ergocalciferol, (DRISDOL) 50000 UNITS CAPS capsule Take 1,000 Units by mouth.      Marland Kitchen amoxicillin-clavulanate (AUGMENTIN) 875-125 MG per tablet Take 1 tablet by mouth 2 (two) times daily.  20 tablet  0  . diazepam (VALIUM) 5 MG tablet Take 5 mg by mouth every 6 (six) hours as needed for anxiety.      Marland Kitchen HYDROcodone-ibuprofen (VICOPROFEN) 7.5-200 MG per tablet Take 1 tablet by mouth every 8 (eight) hours as needed for pain.      . methimazole (TAPAZOLE) 10 MG tablet Take 10 mg by mouth 3 (three) times daily.      . metoprolol succinate (TOPROL-XL) 50 MG 24 hr tablet Take 50 mg by mouth daily. Take with or immediately following a meal.      . Progesterone 100 MG SUPP Place vaginally.       No current facility-administered medications on file prior to visit.    Review of Systems:  As per HPI, otherwise negative.    Physical Examination: Filed Vitals:   05/26/13 0800  BP: 120/80  Pulse: 97  Temp: 98.7 F (37.1 C)  Resp: 18   Filed Vitals:   05/26/13 0800  Height: 5' 2.25" (1.581 m)  Weight: 234 lb 3.2 oz (106.232 kg)   Body mass index is 42.5 kg/(m^2). Ideal Body Weight: Weight in (lb) to  have BMI = 25: 137.5  GEN: obese, NAD, Non-toxic, A & O x 3 HEENT: Atraumatic, Normocephalic. Neck supple. No masses, No LAD. Ears and Nose: No external deformity. CV: RRR, No M/G/R. No JVD. No thrill. No extra heart sounds. PULM: CTA B, no wheezes, crackles, rhonchi. No retractions. No resp. distress. No accessory muscle use. ABD: S, NT, ND, +BS. No rebound. No HSM. EXTR: No c/c/e NEURO Normal gait.  PSYCH: Normally interactive. Conversant. Not depressed or anxious appearing.  Calm demeanor.    Assessment and Plan: Viral gastroenteritis Imodium zofran Clears   Signed,  Phillips OdorJeffery Talya Quain, MD

## 2013-05-30 ENCOUNTER — Encounter: Payer: Self-pay | Admitting: *Deleted

## 2013-06-07 ENCOUNTER — Encounter: Payer: Self-pay | Admitting: *Deleted

## 2013-06-07 DIAGNOSIS — J387 Other diseases of larynx: Secondary | ICD-10-CM

## 2013-06-07 DIAGNOSIS — R1310 Dysphagia, unspecified: Secondary | ICD-10-CM | POA: Insufficient documentation

## 2013-06-07 DIAGNOSIS — J351 Hypertrophy of tonsils: Secondary | ICD-10-CM | POA: Insufficient documentation

## 2013-08-05 ENCOUNTER — Ambulatory Visit (INDEPENDENT_AMBULATORY_CARE_PROVIDER_SITE_OTHER): Payer: PRIVATE HEALTH INSURANCE | Admitting: Emergency Medicine

## 2013-08-05 VITALS — BP 128/80 | HR 104 | Temp 98.0°F | Resp 17 | Ht 64.0 in | Wt 234.0 lb

## 2013-08-05 DIAGNOSIS — R509 Fever, unspecified: Secondary | ICD-10-CM

## 2013-08-05 DIAGNOSIS — J029 Acute pharyngitis, unspecified: Secondary | ICD-10-CM

## 2013-08-05 DIAGNOSIS — R000 Tachycardia, unspecified: Secondary | ICD-10-CM

## 2013-08-05 LAB — POCT CBC
Granulocyte percent: 81.4 %G — AB (ref 37–80)
HCT, POC: 42.2 % (ref 37.7–47.9)
Hemoglobin: 13.7 g/dL (ref 12.2–16.2)
Lymph, poc: 2.8 (ref 0.6–3.4)
MCH, POC: 29.1 pg (ref 27–31.2)
MCHC: 32.5 g/dL (ref 31.8–35.4)
MCV: 89.7 fL (ref 80–97)
MID (cbc): 1.1 — AB (ref 0–0.9)
MPV: 8.7 fL (ref 0–99.8)
POC Granulocyte: 16.8 — AB (ref 2–6.9)
POC LYMPH PERCENT: 13.5 %L (ref 10–50)
POC MID %: 5.1 %M (ref 0–12)
Platelet Count, POC: 465 10*3/uL — AB (ref 142–424)
RBC: 4.7 M/uL (ref 4.04–5.48)
RDW, POC: 14.3 %
WBC: 20.6 10*3/uL — AB (ref 4.6–10.2)

## 2013-08-05 LAB — POCT RAPID STREP A (OFFICE): Rapid Strep A Screen: NEGATIVE

## 2013-08-05 MED ORDER — CEFTRIAXONE SODIUM 1 G IJ SOLR
500.0000 mg | Freq: Once | INTRAMUSCULAR | Status: AC
  Administered 2013-08-05: 500 mg via INTRAMUSCULAR

## 2013-08-05 MED ORDER — AMOXICILLIN-POT CLAVULANATE 875-125 MG PO TABS: 1.0000 | ORAL_TABLET | Freq: Two times a day (BID) | ORAL | Status: DC

## 2013-08-05 NOTE — Progress Notes (Addendum)
Subjective:  This chart was scribed for Lesle ChrisSteven Daub, MD by Elveria Risingimelie Horne, Medial Scribe. This patient was seen in room 10 and the patient's care was started at 8:20 AM.    Patient ID: Chelsey Walker, female    DOB: 07/11/1980, 33 y.o.   MRN: [id number]  HPI HPI Comments: Chelsey Walker is a 33 y.o. female who presents to the [facility] complaining of sore throat with associated fever, chills and headache. Patient states that her sore throat presented two days ago and last night she began experiencing headache and chills. Patient shares history of tonsillitis and acid reflux. Historical treatment with antibiotics was effective in providing relief of her symptoms. Patient denies recent sick contacts with similar symptoms. Patient reports smoking cessation 1 year ago.   Patient treated for Tonsillitis in 09/14 and 02/15. Cultured Strep not Group A in the past.   Patient Active Problem List   Diagnosis Date Noted  . 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 Hypertrophy of tonsils alone 06/07/2013  . Dysphagia, unspecified(787.20) 06/07/2013   Past Medical History  Diagnosis Date  . Seizures   . Anxiety   . Hyperthyroidism   . PCOS (polycystic ovarian syndrome)    Past Surgical History  Procedure Laterality Date  . Knee surgery  2003   No Known Allergies Prior to Admission medications   Medication Sig Start Date End Date Taking? Authorizing Kyshaun Barnette  levothyroxine (SYNTHROID, LEVOTHROID) 100 MCG tablet Take 100 mcg by mouth daily before breakfast.   Yes Historical Peyson Delao, MD  norgestrel-ethinyl estradiol (LO/OVRAL,CRYSELLE) 0.3-30 MG-MCG tablet Take 1 tablet by mouth daily.   Yes Historical Vlad Mayberry, MD  omeprazole (PRILOSEC) 20 MG capsule Take 20 mg by mouth daily.   Yes Historical Yahira Timberman, MD   History   Social History  . Marital Status: Single    Spouse Name: N/A    Number of Children: N/A  . Years of Education: N/A   Occupational History  . Not on  file.   Social History Main Topics  . Smoking status: Former Smoker -- 0.50 packs/day    Types: Cigarettes  . Smokeless tobacco: Not on file  . Alcohol Use: Yes     Comment: occasionally  . Drug Use: No  . Sexual Activity: Yes    Birth Control/ Protection: None   Other Topics Concern  . Not on file   Social History Narrative  . No narrative on file      Review of Systems  Constitutional: Positive for fever and chills.  HENT: Positive for voice change.   Respiratory: Negative for cough.   Neurological: Positive for headaches.       Objective:   Physical Exam  CONSTITUTIONAL: Well developed/well nourished; Alert but patient appears ill HEAD: Normocephalic/atraumatic EYES: EOMI/PERRL ENMT: Mucous membranes moist; throat is very red, tonsils enlarged and red NECK: supple no meningeal signs; no adenopathy  SPINE:entire spine nontender CV: S1/S2 noted, no murmurs/rubs/gallops noted LUNGS: Lungs are clear to auscultation bilaterally, no apparent distress ABDOMEN: soft, nontender, no rebound or guarding GU:no cva tenderness NEURO: Pt is awake/alert, moves all extremitiesx4 EXTREMITIES: pulses normal, full ROM SKIN: warm, color normal PSYCH: no abnormalities of mood noted  Filed Vitals:   08/05/13 0812  BP: 128/80  Pulse: 104  Temp: 98 F (36.7 C)  Resp: 17   Results for orders placed in visit on 08/05/13  POCT RAPID STREP A (OFFICE)      Result Value Ref Range  Rapid Strep A Screen Negative  Negative   Results for orders placed in visit on 08/05/13  POCT RAPID STREP A (OFFICE)      Result Value Ref Range   Rapid Strep A Screen Negative  Negative  POCT CBC      Result Value Ref Range   WBC 20.6 (*) 4.6 - 10.2 K/uL   Lymph, poc 2.8  0.6 - 3.4   POC LYMPH PERCENT 13.5  10 - 50 %L   MID (cbc) 1.1 (*) 0 - 0.9   POC MID % 5.1  0 - 12 %M   POC Granulocyte 16.8 (*) 2 - 6.9   Granulocyte percent 81.4 (*) 37 - 80 %G   RBC 4.70  4.04 - 5.48 M/uL   Hemoglobin  13.7  12.2 - 16.2 g/dL   HCT, POC 47.842.2  29.537.7 - 47.9 %   MCV 89.7  80 - 97 fL   MCH, POC 29.1  27 - 31.2 pg   MCHC 32.5  31.8 - 35.4 g/dL   RDW, POC 62.114.3     Platelet Count, POC 465 (*) 142 - 424 K/uL   MPV 8.7  0 - 99.8 fL   Meds ordered this encounter  Medications  . omeprazole (PRILOSEC) 20 MG capsule    Sig: Take 20 mg by mouth daily.  . norgestrel-ethinyl estradiol (LO/OVRAL,CRYSELLE) 0.3-30 MG-MCG tablet    Sig: Take 1 tablet by mouth daily.  Marland Kitchen. amoxicillin-clavulanate (AUGMENTIN) 875-125 MG per tablet    Sig: Take 1 tablet by mouth 2 (two) times daily.    Dispense:  20 tablet    Refill:  0   ROCEPHIN 1 Gram    Assessment & Plan:  She has grown a non-group A strep in the past. When she was ill at that time showed a white count of 19,000. We'll go ahead and give 1 g of Rocephin and started on Augmentin. Recheck 48 hours if not improving. She was advised to be sure she forces fluids. I personally performed the services described in this documentation, which was scribed in my presence. The recorded information has been reviewed and is accurate.

## 2013-08-05 NOTE — Patient Instructions (Signed)

## 2013-08-07 LAB — CULTURE, GROUP A STREP: Organism ID, Bacteria: NORMAL

## 2013-10-05 ENCOUNTER — Emergency Department (HOSPITAL_COMMUNITY)
Admission: EM | Admit: 2013-10-05 | Discharge: 2013-10-05 | Disposition: A | Discharge status: Home / Self Care | Payer: Self-pay | Attending: Emergency Medicine | Admitting: Emergency Medicine

## 2013-10-05 ENCOUNTER — Emergency Department (HOSPITAL_COMMUNITY): Payer: PRIVATE HEALTH INSURANCE

## 2013-10-05 ENCOUNTER — Encounter (HOSPITAL_COMMUNITY): Payer: Self-pay | Admitting: Emergency Medicine

## 2013-10-05 ENCOUNTER — Emergency Department (HOSPITAL_COMMUNITY): Payer: Self-pay

## 2013-10-05 DIAGNOSIS — Z87891 Personal history of nicotine dependence: Secondary | ICD-10-CM | POA: Insufficient documentation

## 2013-10-05 DIAGNOSIS — Z8659 Personal history of other mental and behavioral disorders: Secondary | ICD-10-CM | POA: Insufficient documentation

## 2013-10-05 DIAGNOSIS — R079 Chest pain, unspecified: Secondary | ICD-10-CM | POA: Insufficient documentation

## 2013-10-05 DIAGNOSIS — Z8742 Personal history of other diseases of the female genital tract: Secondary | ICD-10-CM | POA: Insufficient documentation

## 2013-10-05 DIAGNOSIS — Z79899 Other long term (current) drug therapy: Secondary | ICD-10-CM | POA: Insufficient documentation

## 2013-10-05 DIAGNOSIS — Z8669 Personal history of other diseases of the nervous system and sense organs: Secondary | ICD-10-CM | POA: Insufficient documentation

## 2013-10-05 DIAGNOSIS — K8020 Calculus of gallbladder without cholecystitis without obstruction: Secondary | ICD-10-CM | POA: Insufficient documentation

## 2013-10-05 LAB — CBC
HCT: 39.5 % (ref 36.0–46.0)
Hemoglobin: 13.5 g/dL (ref 12.0–15.0)
MCH: 29.2 pg (ref 26.0–34.0)
MCHC: 34.2 g/dL (ref 30.0–36.0)
MCV: 85.3 fL (ref 78.0–100.0)
Platelets: 403 10*3/uL — ABNORMAL HIGH (ref 150–400)
RBC: 4.63 MIL/uL (ref 3.87–5.11)
RDW: 13.6 % (ref 11.5–15.5)
WBC: 21.7 10*3/uL — ABNORMAL HIGH (ref 4.0–10.5)

## 2013-10-05 LAB — COMPREHENSIVE METABOLIC PANEL
ALT: 44 U/L — ABNORMAL HIGH (ref 0–35)
AST: 61 U/L — ABNORMAL HIGH (ref 0–37)
Albumin: 3.7 g/dL (ref 3.5–5.2)
Alkaline Phosphatase: 80 U/L (ref 39–117)
Anion gap: 15 (ref 5–15)
BUN: 12 mg/dL (ref 6–23)
CO2: 20 mEq/L (ref 19–32)
Calcium: 9.5 mg/dL (ref 8.4–10.5)
Chloride: 103 mEq/L (ref 96–112)
Creatinine, Ser: 0.71 mg/dL (ref 0.50–1.10)
GFR calc Af Amer: >90 mL/min (ref >90)
GFR calc non Af Amer: >90 mL/min (ref >90)
Glucose, Bld: 153 mg/dL — ABNORMAL HIGH (ref 70–99)
Potassium: 3.9 mEq/L (ref 3.7–5.3)
Sodium: 138 mEq/L (ref 137–147)
Total Bilirubin: 0.5 mg/dL (ref 0.3–1.2)
Total Protein: 7.6 g/dL (ref 6.0–8.3)

## 2013-10-05 LAB — I-STAT TROPONIN, ED: Troponin i, poc: 0 ng/mL (ref 0.00–0.08)

## 2013-10-05 LAB — LIPASE, BLOOD: Lipase: 34 U/L (ref 11–59)

## 2013-10-05 MED ORDER — HYDROCODONE-ACETAMINOPHEN 5-325 MG PO TABS: 1.0000 | ORAL_TABLET | ORAL | Status: DC | PRN

## 2013-10-05 MED ORDER — PROMETHAZINE HCL 25 MG PO TABS: 25.0000 mg | ORAL_TABLET | Freq: Four times a day (QID) | ORAL | Status: DC | PRN

## 2013-10-05 MED ORDER — MORPHINE SULFATE 4 MG/ML IJ SOLN
4.0000 mg | Freq: Once | INTRAMUSCULAR | Status: AC
  Administered 2013-10-05: 4 mg via INTRAVENOUS
  Filled 2013-10-05: qty 1

## 2013-10-05 MED ORDER — ONDANSETRON HCL 4 MG/2ML IJ SOLN
4.0000 mg | Freq: Once | INTRAMUSCULAR | Status: AC
  Administered 2013-10-05: 4 mg via INTRAVENOUS
  Filled 2013-10-05: qty 2

## 2013-10-05 NOTE — ED Notes (Signed)
THe patient said she has been having some chest pain and abdominal pain since 0100hrs this morning.  The patient also said she has thrown up three or four times and they were bright red blood.  The last one was pink.  She rates her pain 10/10 both her chest and abdomen.

## 2013-10-05 NOTE — ED Notes (Signed)
Dr Ward back at the bedside.

## 2013-10-05 NOTE — ED Provider Notes (Signed)
TIME SEEN: 7:10 AM  CHIEF COMPLAINT: Abdominal pain, chest pain, vomiting  HPI: Patient is a 33 year old female with history of GERD, hypothyroidism who presents emergency department with complaints of upper abdominal pain that radiates into her lower chest she is unable to describe this been present intermittently for the past 2 weeks. She started having vomiting at 1 AM last night. She has had right appearing vomit but she thinks may be blended. No clots. No bloody stool or melena. Denies history of heavy alcohol use or NSAID use. No prior history of abdominal surgery, sick contacts or international travel. She denies any fevers, chills, diarrhea. States this does not feel like her acid reflux. She is on omeprazole daily. No associated shortness of breath, diaphoresis or dizziness.   ROS: See HPI Constitutional: no fever  Eyes: no drainage  ENT: no runny nose   Cardiovascular:  no chest pain  Resp: no SOB  GI: no vomiting GU: no dysuria Integumentary: no rash  Allergy: no hives  Musculoskeletal: no leg swelling  Neurological: no slurred speech ROS otherwise negative  PAST MEDICAL HISTORY/PAST SURGICAL HISTORY:  Past Medical History  Diagnosis Date  . Seizures   . Anxiety   . Hyperthyroidism   . PCOS (polycystic ovarian syndrome)     MEDICATIONS:  Prior to Admission medications   Medication Sig Start Date End Date Taking? Authorizing Provider  amoxicillin-clavulanate (AUGMENTIN) 875-125 MG per tablet Take 1 tablet by mouth 2 (two) times daily. 08/05/13   Collene Gobble, MD  levothyroxine (SYNTHROID, LEVOTHROID) 100 MCG tablet Take 100 mcg by mouth daily before breakfast.    Historical Provider, MD  norgestrel-ethinyl estradiol (LO/OVRAL,CRYSELLE) 0.3-30 MG-MCG tablet Take 1 tablet by mouth daily.    Historical Provider, MD  omeprazole (PRILOSEC) 20 MG capsule Take 20 mg by mouth daily.    Historical Provider, MD    ALLERGIES:  No Known Allergies  SOCIAL HISTORY:  History   Substance Use Topics  . Smoking status: Former Smoker -- 0.50 packs/day    Types: Cigarettes  . Smokeless tobacco: Not on file  . Alcohol Use: Yes     Comment: occasionally    FAMILY HISTORY: Family History  Problem Relation Age of Onset  . Seizures Mother   . Mental illness Mother   . Diabetes Father   . Hypertension Father   . Mental retardation Sister   . Diabetes Maternal Grandmother   . Diabetes Paternal Grandmother   . Hypertension Paternal Grandmother     EXAM: BP 126/77  Pulse 82  Temp(Src) 98.5 F (36.9 C) (Oral)  Resp 16  Ht 5\' 5"  (1.651 m)  SpO2 96%  LMP 09/29/2013 CONSTITUTIONAL: Alert and oriented and responds appropriately to questions. Well-appearing; well-nourished HEAD: Normocephalic EYES: Conjunctivae clear, PERRL ENT: normal nose; no rhinorrhea; moist mucous membranes; pharynx without lesions noted NECK: Supple, no meningismus, no LAD  CARD: RRR; S1 and S2 appreciated; no murmurs, no clicks, no rubs, no gallops RESP: Normal chest excursion without splinting or tachypnea; breath sounds clear and equal bilaterally; no wheezes, no rhonchi, no rales,  ABD/GI: Normal bowel sounds; non-distended; soft, tender to palpation in her right upper quadrant epigastric region, positive Murphy sign, no voluntary guarding, no rebound, no peritoneal signs BACK:  The back appears normal and is non-tender to palpation, there is no CVA tenderness EXT: Normal ROM in all joints; non-tender to palpation; no edema; normal capillary refill; no cyanosis    SKIN: Normal color for age and race; warm NEURO: Moves  all extremities equally PSYCH: The patient's mood and manner are appropriate. Grooming and personal hygiene are appropriate.  MEDICAL DECISION MAKING: Patient here with possible peptic ulcer versus cholecystitis versus cholelithiasis. Differential includes GERD, gastritis, pancreatitis, colitis, less likely ACS. Will obtain abdominal labs, troponin. We'll give IV fluids,  pain and nausea medication. We'll obtain x-ray of her abdomen to evaluate for possible perforated ulcer and right upper quadrant ultrasound.  ED PROGRESS: Patient appears to gallstones without any signs of cholecystitis. AST and ALT are mildly elevated otherwise liver function tests and lipase are normal. Her pain is been controlled after one dose of morphine and she has had no further vomiting since one dose of Zofran. She does appear to have a leukocytosis however this appears chronic. In that 2014 her WBC was 19.3, today is 21.7. In June of this year was 20.6 and she was not having any symptoms than these labs are drawn routinely by her endocrinologist. Discussed with patient I recommend low-fat diet and follow up with general surgery if symptoms continue. Discussed return precautions. She verbalizes understanding and is comfortable with plan.     Date: 10/05/2013 6:42 AM  Rate: 103  Rhythm: Sinus tachycardia  QRS Axis: normal  Intervals: normal  ST/T Wave abnormalities: normal  Conduction Disutrbances: none  Narrative Interpretation: Sinus tachycardia, nonspecific ST flattening diffusely, no old for comparison      WBC  Date Value Ref Range Status  10/05/2013 21.7* 4.0 - 10.5 K/uL Final  08/05/2013 20.6* 4.6 - 10.2 K/uL Final  11/07/2012 19.3* 4.6 - 10.2 K/uL Final  05/21/2012 12.0* 4.6 - 10.2 K/uL Final  08/14/2010 10.5  4.0 - 10.5 K/uL Final  10/07/2006 14.4*  Final      Kristen N Ward, DO 10/05/13 [id number]

## 2013-10-05 NOTE — Discharge Instructions (Signed)
Cholelithiasis  Cholelithiasis (also called gallstones) is a form of gallbladder disease in which gallstones form in your gallbladder. The gallbladder is an organ that stores bile made in the liver, which helps digest fats. Gallstones begin as small crystals and slowly grow into stones. Gallstone pain occurs when the gallbladder spasms and a gallstone is blocking the duct. Pain can also occur when a stone passes out of the duct.   RISK FACTORS   Being female.    Having multiple pregnancies. Health care providers sometimes advise removing diseased gallbladders before future pregnancies.    Being obese.   Eating a diet heavy in fried foods and fat.    Being older than 60 years and increasing age.    Prolonged use of medicines containing female hormones.    Having diabetes mellitus.    Rapidly losing weight.    Having a family history of gallstones (heredity).   SYMPTOMS   Nausea.    Vomiting.   Abdominal pain.    Yellowing of the skin (jaundice).    Sudden pain. It may persist from several minutes to several hours.   Fever.    Tenderness to the touch.  In some cases, when gallstones do not move into the bile duct, people have no pain or symptoms. These are called "silent" gallstones.   TREATMENT  Silent gallstones do not need treatment. In severe cases, emergency surgery may be required. Options for treatment include:   Surgery to remove the gallbladder. This is the most common treatment.   Medicines. These do not always work and may take 6-12 months or more to work.   Shock wave treatment (extracorporeal biliary lithotripsy). In this treatment an ultrasound machine sends shock waves to the gallbladder to break gallstones into smaller pieces that can pass into the intestines or be dissolved by medicine.  HOME CARE INSTRUCTIONS    Only take over-the-counter or prescription medicines for pain, discomfort, or fever as directed by your health care provider.    Follow a low-fat diet until  seen again by your health care provider. Fat causes the gallbladder to contract, which can result in pain.    Follow up with your health care provider as directed. Attacks are almost always recurrent and surgery is usually required for permanent treatment.   SEEK IMMEDIATE MEDICAL CARE IF:    Your pain increases and is not controlled by medicines.    You have a fever or persistent symptoms for more than 2-3 days.    You have a fever and your symptoms suddenly get worse.    You have persistent nausea and vomiting.   MAKE SURE YOU:    Understand these instructions.   Will watch your condition.   Will get help right away if you are not doing well or get worse.  Document Released: 01/28/2005 Document Revised: 10/04/2012 Document Reviewed: 07/26/2012  Exit[employer] Patient Information 2015 [employer]. This information is not intended to replace advice given to you by your health care provider. Make sure you discuss any questions you have with your health care provider.    Low-Fat Diet for Pancreatitis or Gallbladder Conditions  A low-fat diet can be helpful if you have pancreatitis or a gallbladder condition. With these conditions, your pancreas and gallbladder have trouble digesting fats. A healthy eating plan with less fat will help rest your pancreas and gallbladder and reduce your symptoms.  WHAT DO I NEED TO KNOW ABOUT THIS DIET?   Eat a low-fat diet.   Reduce your   fat intake to less than 20-30% of your total daily calories. This is less than 50-60 g of fat per day.   Remember that you need some fat in your diet. Ask your dietician what your daily goal should be.   Choose nonfat and low-fat healthy foods. Look for the words "nonfat," "low fat," or "fat free."   As a guide, look on the label and choose foods with less than 3 g of fat per serving. Eat only one serving.   Avoid alcohol.   Do not smoke. If you need help quitting, talk with your health care provider.   Eat small frequent meals  instead of three large heavy meals.  WHAT FOODS CAN I EAT?  Grains  Include healthy grains and starches such as potatoes, wheat bread, fiber-rich cereal, and brown rice. Choose whole grain options whenever possible. In adults, whole grains should account for 45-65% of your daily calories.   Fruits and Vegetables  Eat plenty of fruits and vegetables. Fresh fruits and vegetables add fiber to your diet.  Meats and Other Protein Sources  Eat lean meat such as chicken and pork. Trim any fat off of meat before cooking it. Eggs, fish, and beans are other sources of protein. In adults, these foods should account for 10-35% of your daily calories.  Dairy  Choose low-fat milk and dairy options. Dairy includes fat and protein, as well as calcium.   Fats and Oils  Limit high-fat foods such as fried foods, sweets, baked goods, sugary drinks.   Other  Creamy sauces and condiments, such as mayonnaise, can add extra fat. Think about whether or not you need to use them, or use smaller amounts or low fat options.  WHAT FOODS ARE NOT RECOMMENDED?   High fat foods, such as:   Baked goods.   Ice cream.   French toast.   Sweet rolls.   Pizza.   Cheese bread.   Foods covered with batter, butter, creamy sauces, or cheese.   Fried foods.   Sugary drinks and desserts.   Foods that cause gas or bloating  Document Released: 02/06/2013 Document Reviewed: 02/06/2013  [employer] Patient Information 2015 [employer]. This information is not intended to replace advice given to you by your health care provider. Make sure you discuss any questions you have with your health care provider.

## 2013-10-18 ENCOUNTER — Other Ambulatory Visit (INDEPENDENT_AMBULATORY_CARE_PROVIDER_SITE_OTHER): Payer: Self-pay | Admitting: General Surgery

## 2013-10-18 ENCOUNTER — Ambulatory Visit (INDEPENDENT_AMBULATORY_CARE_PROVIDER_SITE_OTHER): Payer: PRIVATE HEALTH INSURANCE | Admitting: General Surgery

## 2013-11-09 ENCOUNTER — Encounter (HOSPITAL_COMMUNITY): Payer: Self-pay

## 2013-11-09 ENCOUNTER — Encounter (HOSPITAL_COMMUNITY)
Admission: RE | Admit: 2013-11-09 | Discharge: 2013-11-09 | Disposition: A | Discharge status: Home / Self Care | Payer: PRIVATE HEALTH INSURANCE | Source: Ambulatory Visit | Attending: General Surgery | Admitting: General Surgery

## 2013-11-09 DIAGNOSIS — E0590 Thyrotoxicosis, unspecified without thyrotoxic crisis or storm: Secondary | ICD-10-CM | POA: Diagnosis not present

## 2013-11-09 DIAGNOSIS — R569 Unspecified convulsions: Secondary | ICD-10-CM | POA: Diagnosis not present

## 2013-11-09 DIAGNOSIS — K219 Gastro-esophageal reflux disease without esophagitis: Secondary | ICD-10-CM | POA: Diagnosis not present

## 2013-11-09 DIAGNOSIS — Z01812 Encounter for preprocedural laboratory examination: Secondary | ICD-10-CM | POA: Diagnosis not present

## 2013-11-09 DIAGNOSIS — K8010 Calculus of gallbladder with chronic cholecystitis without obstruction: Secondary | ICD-10-CM | POA: Diagnosis not present

## 2013-11-09 DIAGNOSIS — R1013 Epigastric pain: Secondary | ICD-10-CM | POA: Diagnosis present

## 2013-11-09 DIAGNOSIS — F411 Generalized anxiety disorder: Secondary | ICD-10-CM | POA: Diagnosis not present

## 2013-11-09 DIAGNOSIS — Z87891 Personal history of nicotine dependence: Secondary | ICD-10-CM | POA: Diagnosis not present

## 2013-11-09 HISTORY — DX: Disorder of thyroid, unspecified: E07.9

## 2013-11-09 HISTORY — DX: Gastro-esophageal reflux disease without esophagitis: K21.9

## 2013-11-09 HISTORY — DX: Headache: R51

## 2013-11-09 LAB — CBC
HCT: 38.9 % (ref 36.0–46.0)
Hemoglobin: 13.2 g/dL (ref 12.0–15.0)
MCH: 29 pg (ref 26.0–34.0)
MCHC: 33.9 g/dL (ref 30.0–36.0)
MCV: 85.5 fL (ref 78.0–100.0)
Platelets: 428 10*3/uL — ABNORMAL HIGH (ref 150–400)
RBC: 4.55 MIL/uL (ref 3.87–5.11)
RDW: 13.7 % (ref 11.5–15.5)
WBC: 15.2 10*3/uL — ABNORMAL HIGH (ref 4.0–10.5)

## 2013-11-09 LAB — BASIC METABOLIC PANEL
Anion gap: 13 (ref 5–15)
BUN: 13 mg/dL (ref 6–23)
CO2: 22 mEq/L (ref 19–32)
Calcium: 8.8 mg/dL (ref 8.4–10.5)
Chloride: 102 mEq/L (ref 96–112)
Creatinine, Ser: 0.78 mg/dL (ref 0.50–1.10)
GFR calc Af Amer: >90 mL/min (ref >90)
GFR calc non Af Amer: >90 mL/min (ref >90)
Glucose, Bld: 97 mg/dL (ref 70–99)
Potassium: 4 mEq/L (ref 3.7–5.3)
Sodium: 137 mEq/L (ref 137–147)

## 2013-11-09 LAB — HCG, SERUM, QUALITATIVE: Preg, Serum: NEGATIVE

## 2013-11-09 NOTE — Progress Notes (Signed)
Pt denies SOB, chest pain, and being under the care of a cardiologist. Pt denies having a chest x ray within the last year. Pt denies having a stress test, echo, and cardiac cath. Spoke with Revonda Standard, PA ( anesthesia) regarding WBC 15.2 and was advised to contact surgeon. Dr. Carolynne Edouard made aware that pt WBC count was 15.2, pt had a runny nose but was afebrile (Temp 98.4). No new orders were given.

## 2013-11-09 NOTE — Pre-Procedure Instructions (Signed)
Monserath Neff [facility]  11/09/2013   Your procedure is scheduled on: Monday, November 12, 2013  Report to [facility] Admitting at 5:30 AM.  Call this number if you have problems the morning of surgery: [phone]   Remember:   Do not eat food or drink liquids after midnight.   Take these medicines the morning of surgery with A SIP OF WATER: levothyroxine (SYNTHROID),  omeprazole (PRILOSEC), norgestrel-ethinyl estradiol (LO/OVRAL,CRYSELLE)   if needed: promethazine (PHENERGAN) for nausea or vomiting.  Stop taking Aspirin, vitamins, and herbal medications. Do not take any NSAIDs ie: Ibuprofen, Advil, Naproxen or any medication containing Aspirin.   Do not wear jewelry, make-up or nail polish.  Do not wear lotions, powders, or perfumes. You may not wear deodorant.  Do not shave 48 hours prior to surgery.   Do not bring valuables to the hospital.  [facility] is not responsible for any belongings or valuables.               Contacts, dentures or bridgework may not be worn into surgery.  Leave suitcase in the car. After surgery it may be brought to your room.  For patients admitted to the hospital, discharge time is determined by your treatment team.               Patients discharged the day of surgery will not be allowed to drive home.  Name and phone number of your driver:   Special Instructions:  Special Instructions:Special Instructions: [facility] - Preparing for Surgery  Before surgery, you can play an important role.  Because skin is not sterile, your skin needs to be as free of germs as possible.  You can reduce the number of germs on you skin by washing with CHG (chlorahexidine gluconate) soap before surgery.  CHG is an antiseptic cleaner which kills germs and bonds with the skin to continue killing germs even after washing.  Please DO NOT use if you have an allergy to CHG or antibacterial soaps.  If your skin becomes reddened/irritated stop using the CHG and inform your  nurse when you arrive at Short Stay.  Do not shave (including legs and underarms) for at least 48 hours prior to the first CHG shower.  You may shave your face.  Please follow these instructions carefully:   1.  Shower with CHG Soap the night before surgery and the morning of Surgery.  2.  If you choose to wash your hair, wash your hair first as usual with your normal shampoo.  3.  After you shampoo, rinse your hair and body thoroughly to remove the Shampoo.  4.  Use CHG as you would any other liquid soap.  You can apply chg directly  to the skin and wash gently with scrungie or a clean washcloth.  5.  Apply the CHG Soap to your body ONLY FROM THE NECK DOWN.  Do not use on open wounds or open sores.  Avoid contact with your eyes, ears, mouth and genitals (private parts).  Wash genitals (private parts) with your normal soap.  6.  Wash thoroughly, paying special attention to the area where your surgery will be performed.  7.  Thoroughly rinse your body with warm water from the neck down.  8.  DO NOT shower/wash with your normal soap after using and rinsing off the CHG Soap.  9.  Pat yourself dry with a clean towel.            10.  Wear  clean pajamas.            11.  Place clean sheets on your bed the night of your first shower and do not sleep with pets.  Day of Surgery  Do not apply any lotions/deodorants the morning of surgery.  Please wear clean clothes to the hospital/surgery center.   Please read over the following fact sheets that you were given: Pain Booklet, Coughing and Deep Breathing and Surgical Site Infection Prevention

## 2013-11-11 MED ORDER — CHLORHEXIDINE GLUCONATE 4 % EX LIQD
1.0000 "application " | Freq: Once | CUTANEOUS | Status: DC
  Filled 2013-11-11: qty 15

## 2013-11-11 MED ORDER — CEFAZOLIN SODIUM-DEXTROSE 2-3 GM-% IV SOLR
2.0000 g | INTRAVENOUS | Status: AC
  Administered 2013-11-12: 2 g via INTRAVENOUS
  Filled 2013-11-11: qty 50

## 2013-11-12 ENCOUNTER — Encounter (HOSPITAL_COMMUNITY): Payer: Self-pay | Admitting: Certified Registered Nurse Anesthetist

## 2013-11-12 ENCOUNTER — Ambulatory Visit (HOSPITAL_COMMUNITY): Payer: PRIVATE HEALTH INSURANCE

## 2013-11-12 ENCOUNTER — Ambulatory Visit (HOSPITAL_COMMUNITY): Payer: PRIVATE HEALTH INSURANCE | Admitting: Certified Registered Nurse Anesthetist

## 2013-11-12 ENCOUNTER — Encounter (HOSPITAL_COMMUNITY)
Admission: RE | Disposition: A | Discharge status: Home / Self Care | Payer: Self-pay | Source: Ambulatory Visit | Attending: General Surgery

## 2013-11-12 ENCOUNTER — Encounter (HOSPITAL_COMMUNITY): Payer: PRIVATE HEALTH INSURANCE | Admitting: Certified Registered Nurse Anesthetist

## 2013-11-12 ENCOUNTER — Ambulatory Visit (HOSPITAL_COMMUNITY)
Admission: RE | Admit: 2013-11-12 | Discharge: 2013-11-12 | Disposition: A | Discharge status: Home / Self Care | Payer: PRIVATE HEALTH INSURANCE | Source: Ambulatory Visit | Attending: General Surgery | Admitting: General Surgery

## 2013-11-12 DIAGNOSIS — R569 Unspecified convulsions: Secondary | ICD-10-CM | POA: Insufficient documentation

## 2013-11-12 DIAGNOSIS — Z87891 Personal history of nicotine dependence: Secondary | ICD-10-CM | POA: Insufficient documentation

## 2013-11-12 DIAGNOSIS — K8010 Calculus of gallbladder with chronic cholecystitis without obstruction: Secondary | ICD-10-CM | POA: Insufficient documentation

## 2013-11-12 DIAGNOSIS — K219 Gastro-esophageal reflux disease without esophagitis: Secondary | ICD-10-CM | POA: Insufficient documentation

## 2013-11-12 DIAGNOSIS — K8020 Calculus of gallbladder without cholecystitis without obstruction: Secondary | ICD-10-CM

## 2013-11-12 DIAGNOSIS — E0590 Thyrotoxicosis, unspecified without thyrotoxic crisis or storm: Secondary | ICD-10-CM | POA: Insufficient documentation

## 2013-11-12 DIAGNOSIS — F411 Generalized anxiety disorder: Secondary | ICD-10-CM | POA: Insufficient documentation

## 2013-11-12 DIAGNOSIS — Z01812 Encounter for preprocedural laboratory examination: Secondary | ICD-10-CM | POA: Insufficient documentation

## 2013-11-12 HISTORY — PX: CHOLECYSTECTOMY: SHX55

## 2013-11-12 SURGERY — LAPAROSCOPIC CHOLECYSTECTOMY WITH INTRAOPERATIVE CHOLANGIOGRAM
Anesthesia: General | Site: Abdomen

## 2013-11-12 MED ORDER — PROPOFOL 10 MG/ML IV BOLUS
INTRAVENOUS | Status: AC
  Filled 2013-11-12: qty 20

## 2013-11-12 MED ORDER — 0.9 % SODIUM CHLORIDE (POUR BTL) OPTIME
TOPICAL | Status: DC | PRN
  Administered 2013-11-12: 1000 mL

## 2013-11-12 MED ORDER — NEOSTIGMINE METHYLSULFATE 10 MG/10ML IV SOLN
INTRAVENOUS | Status: AC
  Filled 2013-11-12: qty 3

## 2013-11-12 MED ORDER — LIDOCAINE HCL (CARDIAC) 20 MG/ML IV SOLN
INTRAVENOUS | Status: DC | PRN
  Administered 2013-11-12: 80 mg via INTRAVENOUS

## 2013-11-12 MED ORDER — MIDAZOLAM HCL 5 MG/5ML IJ SOLN
INTRAMUSCULAR | Status: DC | PRN
  Administered 2013-11-12: 2 mg via INTRAVENOUS

## 2013-11-12 MED ORDER — DEXAMETHASONE SODIUM PHOSPHATE 4 MG/ML IJ SOLN
INTRAMUSCULAR | Status: AC
  Filled 2013-11-12: qty 2

## 2013-11-12 MED ORDER — EPHEDRINE SULFATE 50 MG/ML IJ SOLN
INTRAMUSCULAR | Status: AC
  Filled 2013-11-12: qty 1

## 2013-11-12 MED ORDER — HYDROMORPHONE HCL 1 MG/ML IJ SOLN
0.2500 mg | INTRAMUSCULAR | Status: DC | PRN
  Administered 2013-11-12 (×4): 0.5 mg via INTRAVENOUS

## 2013-11-12 MED ORDER — BUPIVACAINE-EPINEPHRINE (PF) 0.25% -1:200000 IJ SOLN
INTRAMUSCULAR | Status: AC
Start: 2013-11-12 — End: 2013-11-12
  Filled 2013-11-12: qty 30

## 2013-11-12 MED ORDER — GLYCOPYRROLATE 0.2 MG/ML IJ SOLN
INTRAMUSCULAR | Status: DC | PRN
  Administered 2013-11-12: .8 mg via INTRAVENOUS

## 2013-11-12 MED ORDER — LACTATED RINGERS IV SOLN
INTRAVENOUS | Status: DC | PRN
  Administered 2013-11-12 (×2): via INTRAVENOUS

## 2013-11-12 MED ORDER — OXYCODONE HCL 5 MG/5ML PO SOLN: 5.0000 mg | Freq: Once | ORAL | Status: AC | PRN

## 2013-11-12 MED ORDER — MIDAZOLAM HCL 2 MG/2ML IJ SOLN
INTRAMUSCULAR | Status: AC
  Filled 2013-11-12: qty 2

## 2013-11-12 MED ORDER — ROCURONIUM BROMIDE 100 MG/10ML IV SOLN
INTRAVENOUS | Status: DC | PRN
  Administered 2013-11-12: 50 mg via INTRAVENOUS

## 2013-11-12 MED ORDER — SODIUM CHLORIDE 0.9 % IR SOLN
Status: DC | PRN
  Administered 2013-11-12: 1000 mL

## 2013-11-12 MED ORDER — FENTANYL CITRATE 0.05 MG/ML IJ SOLN
INTRAMUSCULAR | Status: AC
  Filled 2013-11-12: qty 5

## 2013-11-12 MED ORDER — SUCCINYLCHOLINE CHLORIDE 20 MG/ML IJ SOLN
INTRAMUSCULAR | Status: AC
  Filled 2013-11-12: qty 1

## 2013-11-12 MED ORDER — HYDROMORPHONE HCL 1 MG/ML IJ SOLN
INTRAMUSCULAR | Status: DC
Start: 2013-11-12 — End: 2013-11-12
  Filled 2013-11-12: qty 1

## 2013-11-12 MED ORDER — OXYCODONE-ACETAMINOPHEN 5-325 MG PO TABS
1.0000 | ORAL_TABLET | Freq: Once | ORAL | Status: AC
  Administered 2013-11-12: 1 via ORAL

## 2013-11-12 MED ORDER — NEOSTIGMINE METHYLSULFATE 10 MG/10ML IV SOLN
INTRAVENOUS | Status: DC | PRN
  Administered 2013-11-12: 5 mg via INTRAVENOUS

## 2013-11-12 MED ORDER — OXYCODONE HCL 5 MG PO TABS
5.0000 mg | ORAL_TABLET | Freq: Once | ORAL | Status: AC | PRN
  Administered 2013-11-12: 5 mg via ORAL

## 2013-11-12 MED ORDER — BUPIVACAINE-EPINEPHRINE 0.25% -1:200000 IJ SOLN
INTRAMUSCULAR | Status: DC | PRN
  Administered 2013-11-12: 30 mL

## 2013-11-12 MED ORDER — LIDOCAINE HCL (CARDIAC) 20 MG/ML IV SOLN
INTRAVENOUS | Status: AC
  Filled 2013-11-12: qty 5

## 2013-11-12 MED ORDER — DEXAMETHASONE SODIUM PHOSPHATE 4 MG/ML IJ SOLN
INTRAMUSCULAR | Status: DC | PRN
  Administered 2013-11-12: 8 mg via INTRAVENOUS

## 2013-11-12 MED ORDER — FENTANYL CITRATE 0.05 MG/ML IJ SOLN
INTRAMUSCULAR | Status: DC | PRN
  Administered 2013-11-12: 100 ug via INTRAVENOUS
  Administered 2013-11-12: 50 ug via INTRAVENOUS
  Administered 2013-11-12 (×2): 25 ug via INTRAVENOUS
  Administered 2013-11-12: 50 ug via INTRAVENOUS

## 2013-11-12 MED ORDER — ROCURONIUM BROMIDE 50 MG/5ML IV SOLN
INTRAVENOUS | Status: AC
  Filled 2013-11-12: qty 1

## 2013-11-12 MED ORDER — OXYCODONE HCL 5 MG PO TABS
ORAL_TABLET | ORAL | Status: DC
Start: 2013-11-12 — End: 2013-11-12
  Filled 2013-11-12: qty 1

## 2013-11-12 MED ORDER — STERILE WATER FOR INJECTION IJ SOLN
INTRAMUSCULAR | Status: AC
  Filled 2013-11-12: qty 10

## 2013-11-12 MED ORDER — OXYCODONE-ACETAMINOPHEN 5-325 MG PO TABS: 1.0000 | ORAL_TABLET | ORAL | Status: DC | PRN

## 2013-11-12 MED ORDER — ONDANSETRON HCL 4 MG/2ML IJ SOLN
INTRAMUSCULAR | Status: AC
  Filled 2013-11-12: qty 2

## 2013-11-12 MED ORDER — GLYCOPYRROLATE 0.2 MG/ML IJ SOLN
INTRAMUSCULAR | Status: AC
  Filled 2013-11-12: qty 4

## 2013-11-12 MED ORDER — HYDROMORPHONE HCL 1 MG/ML IJ SOLN
INTRAMUSCULAR | Status: AC
  Filled 2013-11-12: qty 1

## 2013-11-12 MED ORDER — OXYCODONE-ACETAMINOPHEN 5-325 MG PO TABS
ORAL_TABLET | ORAL | Status: AC
  Administered 2013-11-12: 1 via ORAL
  Filled 2013-11-12: qty 1

## 2013-11-12 MED ORDER — PROPOFOL 10 MG/ML IV BOLUS
INTRAVENOUS | Status: DC | PRN
  Administered 2013-11-12: 180 mg via INTRAVENOUS
  Administered 2013-11-12: 20 mg via INTRAVENOUS

## 2013-11-12 MED ORDER — SODIUM CHLORIDE 0.9 % IV SOLN
INTRAVENOUS | Status: DC | PRN
  Administered 2013-11-12: 08:00:00

## 2013-11-12 MED ORDER — ONDANSETRON HCL 4 MG/2ML IJ SOLN
INTRAMUSCULAR | Status: DC | PRN
  Administered 2013-11-12: 4 mg via INTRAVENOUS

## 2013-11-12 SURGICAL SUPPLY — 46 items
APPLIER CLIP 5 13 M/L LIGAMAX5 (MISCELLANEOUS) ×2
APPLIER CLIP ROT 10 11.4 M/L (STAPLE) ×2
BLADE SURG ROTATE 9660 (MISCELLANEOUS) IMPLANT
CANISTER SUCTION 2500CC (MISCELLANEOUS) ×2 IMPLANT
CATH REDDICK CHOLANGI 4FR 50CM (CATHETERS) ×2 IMPLANT
CHLORAPREP W/TINT 26ML (MISCELLANEOUS) ×2 IMPLANT
CLIP APPLIE 5 13 M/L LIGAMAX5 (MISCELLANEOUS) ×1 IMPLANT
CLIP APPLIE ROT 10 11.4 M/L (STAPLE) ×1 IMPLANT
COVER MAYO STAND STRL (DRAPES) ×2 IMPLANT
COVER SURGICAL LIGHT HANDLE (MISCELLANEOUS) ×2 IMPLANT
DECANTER SPIKE VIAL GLASS SM (MISCELLANEOUS) IMPLANT
DERMABOND ADVANCED (GAUZE/BANDAGES/DRESSINGS) ×1
DERMABOND ADVANCED .7 DNX12 (GAUZE/BANDAGES/DRESSINGS) ×1 IMPLANT
DRAPE C-ARM 42X72 X-RAY (DRAPES) ×2 IMPLANT
DRAPE UTILITY 15X26 W/TAPE STR (DRAPE) ×4 IMPLANT
ELECT REM PT RETURN 9FT ADLT (ELECTROSURGICAL) ×2
ELECTRODE REM PT RTRN 9FT ADLT (ELECTROSURGICAL) ×1 IMPLANT
GLOVE BIO SURGEON STRL SZ 6.5 (GLOVE) ×2 IMPLANT
GLOVE BIO SURGEON STRL SZ7 (GLOVE) ×2 IMPLANT
GLOVE BIO SURGEON STRL SZ7.5 (GLOVE) ×2 IMPLANT
GLOVE BIOGEL PI IND STRL 7.0 (GLOVE) ×4 IMPLANT
GLOVE BIOGEL PI IND STRL 7.5 (GLOVE) ×1 IMPLANT
GLOVE BIOGEL PI INDICATOR 7.0 (GLOVE) ×4
GLOVE BIOGEL PI INDICATOR 7.5 (GLOVE) ×1
GLOVE ECLIPSE 7.5 STRL STRAW (GLOVE) ×2 IMPLANT
GLOVE SURG SS PI 7.0 STRL IVOR (GLOVE) ×2 IMPLANT
GOWN STRL REUS W/ TWL LRG LVL3 (GOWN DISPOSABLE) ×5 IMPLANT
GOWN STRL REUS W/TWL LRG LVL3 (GOWN DISPOSABLE) ×5
IV CATH 14GX2 1/4 (CATHETERS) ×2 IMPLANT
KIT BASIN OR (CUSTOM PROCEDURE TRAY) ×2 IMPLANT
KIT ROOM TURNOVER OR (KITS) ×2 IMPLANT
NS IRRIG 1000ML POUR BTL (IV SOLUTION) ×2 IMPLANT
PAD ARMBOARD 7.5X6 YLW CONV (MISCELLANEOUS) ×2 IMPLANT
POUCH SPECIMEN RETRIEVAL 10MM (ENDOMECHANICALS) ×2 IMPLANT
SCISSORS LAP 5X35 DISP (ENDOMECHANICALS) ×2 IMPLANT
SET IRRIG TUBING LAPAROSCOPIC (IRRIGATION / IRRIGATOR) ×2 IMPLANT
SLEEVE ENDOPATH XCEL 5M (ENDOMECHANICALS) ×2 IMPLANT
SPECIMEN JAR SMALL (MISCELLANEOUS) ×2 IMPLANT
SUT MNCRL AB 4-0 PS2 18 (SUTURE) ×2 IMPLANT
TOWEL OR 17X24 6PK STRL BLUE (TOWEL DISPOSABLE) ×2 IMPLANT
TOWEL OR 17X26 10 PK STRL BLUE (TOWEL DISPOSABLE) ×2 IMPLANT
TRAY LAPAROSCOPIC (CUSTOM PROCEDURE TRAY) ×2 IMPLANT
TROCAR XCEL BLUNT TIP 100MML (ENDOMECHANICALS) ×2 IMPLANT
TROCAR XCEL NON-BLD 11X100MML (ENDOMECHANICALS) IMPLANT
TROCAR XCEL NON-BLD 5MMX100MML (ENDOMECHANICALS) ×4 IMPLANT
TUBING INSUF HEATED (TUBING) ×2 IMPLANT

## 2013-11-12 NOTE — Progress Notes (Signed)
Pt ambulated to bathroom, voided without complaint, eating crackers and sipping fluid, denies nausea, family bedside.

## 2013-11-12 NOTE — Anesthesia Preprocedure Evaluation (Addendum)
Anesthesia Evaluation  Patient identified by MRN, date of birth, ID band Patient awake    Reviewed: Allergy & Precautions, H&P , NPO status , Patient's Chart, lab work & pertinent test results  History of Anesthesia Complications Negative for: history of anesthetic complications  Airway Mallampati: III TM Distance: >3 FB Neck ROM: Full    Dental  (+) Dental Advisory Given, Teeth Intact   Pulmonary neg sleep apnea, neg COPDformer smoker,  breath sounds clear to auscultation        Cardiovascular negative cardio ROS  Rhythm:Regular     Neuro/Psych  Headaches, Seizures -,  PSYCHIATRIC DISORDERS Anxiety    GI/Hepatic GERD-  Medicated and Poorly Controlled,gallstones   Endo/Other  Hyperthyroidism Morbid obesity  Renal/GU      Musculoskeletal   Abdominal   Peds  Hematology negative hematology ROS (+)   Anesthesia Other Findings   Reproductive/Obstetrics                         Anesthesia Physical Anesthesia Plan  ASA: II  Anesthesia Plan: General   Post-op Pain Management:    Induction: Intravenous  Airway Management Planned: Oral ETT  Additional Equipment: None  Intra-op Plan:   Post-operative Plan: Extubation in OR  Informed Consent: I have reviewed the patients History and Physical, chart, labs and discussed the procedure including the risks, benefits and alternatives for the proposed anesthesia with the patient or authorized representative who has indicated his/her understanding and acceptance.   Dental advisory given  Plan Discussed with: CRNA and Surgeon  Anesthesia Plan Comments:        Anesthesia Quick Evaluation

## 2013-11-12 NOTE — Discharge Instructions (Signed)
Discharge instructions came with pt/ to be handed to pt at dc

## 2013-11-12 NOTE — Op Note (Signed)
11/12/2013  8:37 AM  PATIENT:  Chelsey Walker  33 y.o. female  PRE-OPERATIVE DIAGNOSIS:  Gallstones  POST-OPERATIVE DIAGNOSIS:  Gallstones  PROCEDURE:  Procedure(s): LAPAROSCOPIC CHOLECYSTECTOMY WITH INTRAOPERATIVE CHOLANGIOGRAM (N/A)  SURGEON:  Surgeon(s) and Role:    * Griselda Miner, MD - Primary  PHYSICIAN ASSISTANT:   ASSISTANTS: Myrtie Soman, RNFA   ANESTHESIA:   general  EBL:  Total I/O In: 1000 [I.V.:1000] Out: 30 [Blood:30]  BLOOD ADMINISTERED:none  DRAINS: none   LOCAL MEDICATIONS USED:  MARCAINE     SPECIMEN:  Source of Specimen:  gallbladder  DISPOSITION OF SPECIMEN:  PATHOLOGY  COUNTS:  YES  TOURNIQUET:  * No tourniquets in log *  DICTATION: .Dragon Dictation  Procedure: After informed consent was obtained the patient was brought to the operating room and placed in the supine position on the operating room table. After adequate induction of general anesthesia the patient's abdomen was prepped with ChloraPrep allowed to dry and draped in usual sterile manner. The area below the umbilicus was infiltrated with quarter percent  Marcaine. A small incision was made with a 15 blade knife. The incision was carried down through the subcutaneous tissue bluntly with a hemostat and [employer] retractors. The linea alba was identified. The linea alba was incised with a 15 blade knife and each side was grasped with Coker clamps. The preperitoneal space was then probed with a hemostat until the peritoneum was opened and access was gained to the abdominal cavity. A 0 Vicryl pursestring stitch was placed in the fascia surrounding the opening. A Hassan cannula was then placed through the opening and anchored in place with the previously placed Vicryl purse string stitch. The abdomen was insufflated with carbon dioxide without difficulty. A laparoscope was inserted through the [facility] cannula in the right upper quadrant was inspected. Next the epigastric region was infiltrated  with % Marcaine. A small incision was made with a 15 blade knife. A 5 mm port was placed bluntly through this incision into the abdominal cavity under direct vision. Next 2 sites were chosen laterally on the right side of the abdomen for placement of 5 mm ports. Each of these areas was infiltrated with quarter percent Marcaine. Small stab incisions were made with a 15 blade knife. 5 mm ports were then placed bluntly through these incisions into the abdominal cavity under direct vision without difficulty. A blunt grasper was placed through the lateralmost 5 mm port and used to grasp the dome of the gallbladder and elevated anteriorly and superiorly. Another blunt grasper was placed through the other 5 mm port and used to retract the body and neck of the gallbladder. A dissector was placed through the epigastric port and using the electrocautery the peritoneal reflection at the gallbladder neck was opened. Blunt dissection was then carried out in this area until the gallbladder neck-cystic duct junction was readily identified and a good window was created. A single clip was placed on the gallbladder neck. A small  ductotomy was made just below the clip with laparoscopic scissors. A 14-gauge Angiocath was then placed through the anterior abdominal wall under direct vision. A Reddick cholangiogram catheter was then placed through the Angiocath and flushed. The catheter was then placed in the cystic duct and anchored in place with a clip. A cholangiogram was obtained that showed no filling defects good emptying into the duodenum an adequate length on the cystic duct. The anchoring clip and catheters were then removed from the patient. 3 clips were placed proximally  on the cystic duct and the duct was divided between the 2 sets of clips. Posterior to this the cystic artery was identified and again dissected bluntly in a circumferential manner until a good window  was created. 2 clips were placed proximally and one  distally on the artery and the artery was divided between the 2 sets of clips. Next a laparoscopic hook cautery device was used to separate the gallbladder from the liver bed. Prior to completely detaching the gallbladder from the liver bed the liver bed was inspected and several small bleeding points were coagulated with the electrocautery until the area was completely hemostatic. The gallbladder was then detached the rest of it from the liver bed without difficulty. A laparoscopic bag was inserted through the epigastric port. The gallbladder was placed within the bag and the bag was sealed. A laparoscope was then moved to the epigastric port. The gallbladder grasper was placed through the [facility] cannula and used to grasp the opening of the bag. The bag with the gallbladder was then removed with the [facility] cannula through the infraumbilical port without difficulty. The fascial defect was then closed with the previously placed Vicryl pursestring stitch as well as with another figure-of-eight 0 Vicryl stitch. The liver bed was inspected again and found to be hemostatic. The abdomen was irrigated with copious amounts of saline until the effluent was clear. The ports were then removed under direct vision without difficulty and were found to be hemostatic. The gas was allowed to escape. The skin incisions were all closed with interrupted 4-0 Monocryl subcuticular stitches. Dermabond dressings were applied. The patient tolerated the procedure well. At the end of the case all needle sponge and instrument counts were correct. The patient was then awakened and taken to recovery in stable condition  PLAN OF CARE: Discharge to home after PACU  PATIENT DISPOSITION:  PACU - hemodynamically stable.   Delay start of Pharmacological VTE agent (>24hrs) due to surgical blood loss or risk of bleeding: not applicable

## 2013-11-12 NOTE — Anesthesia Postprocedure Evaluation (Signed)
  Anesthesia Post-op Note  Patient: Chelsey Walker  Procedure(s) Performed: Procedure(s): LAPAROSCOPIC CHOLECYSTECTOMY WITH INTRAOPERATIVE CHOLANGIOGRAM (N/A)  Patient Location: PACU  Anesthesia Type:General  Level of Consciousness: awake and alert   Airway and Oxygen Therapy: Patient Spontanous Breathing  Post-op Pain: mild  Post-op Assessment: Post-op Vital signs reviewed, Patient's Cardiovascular Status Stable, Respiratory Function Stable, Patent Airway, No signs of Nausea or vomiting and Pain level controlled  Post-op Vital Signs: Reviewed and stable  Last Vitals:  Filed Vitals:   11/12/13 1000  BP: 121/79  Pulse: 88  Temp:   Resp: 14    Complications: No apparent anesthesia complications

## 2013-11-12 NOTE — Transfer of Care (Signed)
Immediate Anesthesia Transfer of Care Note  Patient: Chelsey Walker  Procedure(s) Performed: Procedure(s): LAPAROSCOPIC CHOLECYSTECTOMY WITH INTRAOPERATIVE CHOLANGIOGRAM (N/A)  Patient Location: PACU  Anesthesia Type:General  Level of Consciousness: awake, alert  and oriented  Airway & Oxygen Therapy: Patient Spontanous Breathing and Patient connected to nasal cannula oxygen  Post-op Assessment: Report given to PACU RN, Post -op Vital signs reviewed and stable and Patient moving all extremities X 4  Post vital signs: Reviewed and stable  Complications: No apparent anesthesia complications

## 2013-11-12 NOTE — Interval H&P Note (Signed)
History and Physical Interval Note:  11/12/2013 7:14 AM  Chelsey Walker  has presented today for surgery, with the diagnosis of Gallstones  The various methods of treatment have been discussed with the patient and family. After consideration of risks, benefits and other options for treatment, the patient has consented to  Procedure(s): LAPAROSCOPIC CHOLECYSTECTOMY WITH INTRAOPERATIVE CHOLANGIOGRAM (N/A) as a surgical intervention .  The patient's history has been reviewed, patient examined, no change in status, stable for surgery.  I have reviewed the patient's chart and labs.  Questions were answered to the patient's satisfaction.     TOTH III,PAUL S

## 2013-11-12 NOTE — Progress Notes (Addendum)
Pt having pain 4/10, pt asked for pain med, Dr Carolynne Edouard was paged for oral med request. Pt has script to go home on but none for post op to give.  Dr Carolynne Edouard returned called and orders followed.

## 2013-11-12 NOTE — H&P (Signed)
Chelsey Walker 10/18/2013 2:39 PM Location: [facility] Patient #: [id number] DOB: 09-Aug-1980 Undefined / Language: [place] / Race: White Female  History of Present Illness Chelsey Walker. Chelsey Edouard MD; 10/23/2013 8:58 AM) The patient is a 33 year old female who presents with abdominal pain. the patient is a 33 year old white female who has been experiencing epigastric pain for the last couple weeks. The pain radiates into her back. The pain has been associated with some nausea and vomiting. She went to the emergency department where a ultrasound showed stones in the gallbladder but no gallbladder wall thickening or ductal dilatation. She had very mild elevation of 2 of her liver functions but her bilirubin was normal. Her pancreatic enzymes were also normal   Review of Systems Chelsey Walker S. Chelsey Edouard MD; 10/23/2013 9:00 AM) General Not Present- Appetite Loss, Chills, Fatigue, Fever, Night Sweats, Weight Gain and Weight Loss. Skin Not Present- Change in Wart/Mole, Dryness, Hives, Jaundice, New Lesions, Non-Healing Wounds, Rash and Ulcer. HEENT Not Present- Earache, Hearing Loss, Hoarseness, Nose Bleed, Oral Ulcers, Ringing in the Ears, Seasonal Allergies, Sinus Pain, Sore Throat, Visual Disturbances, Wears glasses/contact lenses and Yellow Eyes. Respiratory Not Present- Bloody sputum, Chronic Cough, Difficulty Breathing, Snoring and Wheezing. Breast Not Present- Breast Mass, Breast Pain, Nipple Discharge and Skin Changes. Cardiovascular Not Present- Chest Pain, Difficulty Breathing Lying Down, Leg Cramps, Palpitations, Rapid Heart Rate, Shortness of Breath and Swelling of Extremities. Gastrointestinal Present- Abdominal Pain and Nausea. Not Present- Bloating, Bloody Stool, Change in Bowel Habits, Chronic diarrhea, Constipation, Difficulty Swallowing, Excessive gas, Gets full quickly at meals, Hemorrhoids, Indigestion, Rectal Pain and Vomiting. Female Genitourinary Not Present- Frequency, Nocturia, Painful  Urination, Pelvic Pain and Urgency. Musculoskeletal Not Present- Back Pain, Joint Pain, Joint Stiffness, Muscle Pain, Muscle Weakness and Swelling of Extremities. Neurological Not Present- Decreased Memory, Fainting, Headaches, Numbness, Seizures, Tingling, Tremor, Trouble walking and Weakness. Psychiatric Not Present- Anxiety, Bipolar, Change in Sleep Pattern, Depression, Fearful and Frequent crying. Endocrine Not Present- Cold Intolerance, Excessive Hunger, Hair Changes, Heat Intolerance, Hot flashes and New Diabetes. Hematology Not Present- Easy Bruising, Excessive bleeding, Gland problems, HIV and Persistent Infections.   Physical Exam Chelsey Walker S. Chelsey Edouard MD; 10/23/2013 9:01 AM) General Mental Status-Alert. General Appearance-Consistent with stated age. Hydration-Well hydrated. Voice-Normal.  Head and Neck Head-normocephalic, atraumatic with no lesions or palpable masses. Trachea-midline. Thyroid Gland Characteristics - normal size and consistency.  Eye Eyeball - Bilateral-Extraocular movements intact. Sclera/Conjunctiva - Bilateral-No scleral icterus.  Chest and Lung Exam Chest and lung exam reveals -quiet, even and easy respiratory effort with no use of accessory muscles, normal resonance, no flatness or dullness, non-tender and normal tactile fremitus and on auscultation, normal breath sounds, no adventitious sounds and normal vocal resonance. Inspection Chest Wall - Normal. Back - normal.  Breast Breast - Left-Symmetric, Non Tender, No Biopsy scars, no Dimpling, No Inflammation, No Lumpectomy scars, No Mastectomy scars, No Peau d' Orange. Breast - Right-Symmetric, Non Tender, No Biopsy scars, no Dimpling, No Inflammation, No Lumpectomy scars, No Mastectomy scars, No Peau d' Orange. Breast Lump-No Palpable Breast Mass.  Cardiovascular Cardiovascular examination reveals -on palpation PMI is normal in location and amplitude, no palpable S3 or S4. Normal  cardiac borders., normal heart sounds, regular rate and rhythm with no murmurs, carotid auscultation reveals no bruits and normal pedal pulses bilaterally.  Abdomen Inspection Inspection of the abdomen reveals - No Hernias. Skin - Scar - no surgical scars. Palpation/Percussion Palpation and Percussion of the abdomen reveal - Soft, No Rebound tenderness, No Rigidity (  guarding) and No hepatosplenomegaly. Note: there is mild epigastric tenderness. Auscultation Auscultation of the abdomen reveals - Bowel sounds normal.  Peripheral Vascular Upper Extremity Inspection - Bilateral - Normal - No Clubbing, No Cyanosis, No Edema, Pulses Intact. Palpation - Pulses bilaterally normal. Lower Extremity Palpation - Pulses bilaterally normal.  Neurologic Neurologic evaluation reveals -alert and oriented x 3 with no impairment of recent or remote memory. Mental Status-Normal.  Musculoskeletal Normal Exam - Left-Upper Extremity Strength Normal and Lower Extremity Strength Normal. Normal Exam - Right-Upper Extremity Strength Normal, Lower Extremity Weakness.  Lymphatic Head & Neck  General Head & Neck Lymphatics: Bilateral - Description - Normal. Axillary  General Axillary Region: Bilateral - Description - Normal. Tenderness - Non Tender. Femoral & Inguinal  Generalized Femoral & Inguinal Lymphatics: Bilateral - Description - Normal. Tenderness - Non Tender.    Assessment & Plan Chelsey Walker S. Toth MD; 10/23/2013 9:03 AM) Chelsey Walker ([phone]  K80.20) Impression: the patient appears to have symptomatic gallstones. Because of the risk of further painful episodes and possible pancreatitis I think she would benefit from having her gallbladder removed. I've discussed with her in detail the risks and benefits of the operation to remove the gallbladder as well as some of the technical aspects and she understands and wishes to proceed. Plan for laparoscopic cholecystectomy with intraoperative  cholangiogram     Signed by Chelsey Essex, MD (10/23/2013 9:03 AM)

## 2013-11-12 NOTE — Anesthesia Procedure Notes (Signed)
Procedure Name: Intubation Date/Time: 11/12/2013 7:42 AM Performed by: Vita Barley E Pre-anesthesia Checklist: Patient identified, Emergency Drugs available, Suction available and Patient being monitored Patient Re-evaluated:Patient Re-evaluated prior to inductionOxygen Delivery Method: Circle system utilized Preoxygenation: Pre-oxygenation with 100% oxygen Intubation Type: IV induction Ventilation: Mask ventilation without difficulty Laryngoscope Size: Mac and 3 Grade View: Grade I Tube type: Oral Tube size: 7.5 mm Number of attempts: 1 Airway Equipment and Method: Stylet Placement Confirmation: ETT inserted through vocal cords under direct vision,  positive ETCO2 and breath sounds checked- equal and bilateral Secured at: 21 cm Tube secured with: Tape Dental Injury: Teeth and Oropharynx as per pre-operative assessment  Comments: Intubation by paramedic student under direct supervision by CRNA and MD.

## 2013-11-14 ENCOUNTER — Encounter (HOSPITAL_COMMUNITY): Payer: Self-pay | Admitting: General Surgery

## 2014-01-07 ENCOUNTER — Ambulatory Visit (INDEPENDENT_AMBULATORY_CARE_PROVIDER_SITE_OTHER): Payer: PRIVATE HEALTH INSURANCE | Admitting: Internal Medicine

## 2014-01-07 VITALS — BP 132/80 | HR 91 | Temp 98.7°F | Resp 18 | Ht 64.0 in | Wt 237.0 lb

## 2014-01-07 DIAGNOSIS — R300 Dysuria: Secondary | ICD-10-CM

## 2014-01-07 DIAGNOSIS — R350 Frequency of micturition: Secondary | ICD-10-CM

## 2014-01-07 DIAGNOSIS — J309 Allergic rhinitis, unspecified: Secondary | ICD-10-CM

## 2014-01-07 DIAGNOSIS — N390 Urinary tract infection, site not specified: Secondary | ICD-10-CM

## 2014-01-07 LAB — POCT UA - MICROSCOPIC ONLY
Crystals, Ur, HPF, POC: NEGATIVE
Mucus, UA: NEGATIVE
Yeast, UA: NEGATIVE

## 2014-01-07 LAB — POCT URINALYSIS DIPSTICK
Glucose, UA: 100
Nitrite, UA: POSITIVE
Protein, UA: 100
Spec Grav, UA: 1.02
Urobilinogen, UA: 2
pH, UA: 5

## 2014-01-07 MED ORDER — CIPROFLOXACIN HCL 500 MG PO TABS: 500.0000 mg | ORAL_TABLET | Freq: Two times a day (BID) | ORAL | Status: DC

## 2014-01-07 NOTE — Patient Instructions (Signed)
Upper Respiratory Infection, Adult °An upper respiratory infection (URI) is also sometimes known as the common cold. The upper respiratory tract includes the nose, sinuses, throat, trachea, and bronchi. Bronchi are the airways leading to the lungs. Most people improve within 1 week, but symptoms can last up to 2 weeks. A residual cough may last even longer.  °CAUSES °Many different viruses can infect the tissues lining the upper respiratory tract. The tissues become irritated and inflamed and often become very moist. Mucus production is also common. A cold is contagious. You can easily spread the virus to others by oral contact. This includes kissing, sharing a glass, coughing, or sneezing. Touching your mouth or nose and then touching a surface, which is then touched by another person, can also spread the virus. °SYMPTOMS  °Symptoms typically develop 1 to 3 days after you come in contact with a cold virus. Symptoms vary from person to person. They may include: °· Runny nose. °· Sneezing. °· Nasal congestion. °· Sinus irritation. °· Sore throat. °· Loss of voice (laryngitis). °· Cough. °· Fatigue. °· Muscle aches. °· Loss of appetite. °· Headache. °· Low-grade fever. °DIAGNOSIS  °You might diagnose your own cold based on familiar symptoms, since most people get a cold 2 to 3 times a year. Your caregiver can confirm this based on your exam. Most importantly, your caregiver can check that your symptoms are not due to another disease such as strep throat, sinusitis, pneumonia, asthma, or epiglottitis. Blood tests, throat tests, and X-rays are not necessary to diagnose a common cold, but they may sometimes be helpful in excluding other more serious diseases. Your caregiver will decide if any further tests are required. °RISKS AND COMPLICATIONS  °You may be at risk for a more severe case of the common cold if you smoke cigarettes, have chronic heart disease (such as heart failure) or lung disease (such as asthma), or if  you have a weakened immune system. The very young and very old are also at risk for more serious infections. Bacterial sinusitis, middle ear infections, and bacterial pneumonia can complicate the common cold. The common cold can worsen asthma and chronic obstructive pulmonary disease (COPD). Sometimes, these complications can require emergency medical care and may be life-threatening. °PREVENTION  °The best way to protect against getting a cold is to practice good hygiene. Avoid oral or hand contact with people with cold symptoms. Wash your hands often if contact occurs. There is no clear evidence that vitamin C, vitamin E, echinacea, or exercise reduces the chance of developing a cold. However, it is always recommended to get plenty of rest and practice good nutrition. °TREATMENT  °Treatment is directed at relieving symptoms. There is no cure. Antibiotics are not effective, because the infection is caused by a virus, not by bacteria. Treatment may include: °· Increased fluid intake. Sports drinks offer valuable electrolytes, sugars, and fluids. °· Breathing heated mist or steam (vaporizer or shower). °· Eating chicken soup or other clear broths, and maintaining good nutrition. °· Getting plenty of rest. °· Using gargles or lozenges for comfort. °· Controlling fevers with ibuprofen or acetaminophen as directed by your caregiver. °· Increasing usage of your inhaler if you have asthma. °Zinc gel and zinc lozenges, taken in the first 24 hours of the common cold, can shorten the duration and lessen the severity of symptoms. Pain medicines may help with fever, muscle aches, and throat pain. A variety of non-prescription medicines are available to treat congestion and runny nose. Your caregiver   can make recommendations and may suggest nasal or lung inhalers for other symptoms.  °HOME CARE INSTRUCTIONS  °· Only take over-the-counter or prescription medicines for pain, discomfort, or fever as directed by your  caregiver. °· Use a warm mist humidifier or inhale steam from a shower to increase air moisture. This may keep secretions moist and make it easier to breathe. °· Drink enough water and fluids to keep your urine clear or pale yellow. °· Rest as needed. °· Return to work when your temperature has returned to normal or as your caregiver advises. You may need to stay home longer to avoid infecting others. You can also use a face mask and careful hand washing to prevent spread of the virus. °SEEK MEDICAL CARE IF:  °· After the first few days, you feel you are getting worse rather than better. °· You need your caregiver's advice about medicines to control symptoms. °· You develop chills, worsening shortness of breath, or brown or red sputum. These may be signs of pneumonia. °· You develop yellow or brown nasal discharge or pain in the face, especially when you bend forward. These may be signs of sinusitis. °· You develop a fever, swollen neck glands, pain with swallowing, or white areas in the back of your throat. These may be signs of strep throat. °SEEK IMMEDIATE MEDICAL CARE IF:  °· You have a fever. °· You develop severe or persistent headache, ear pain, sinus pain, or chest pain. °· You develop wheezing, a prolonged cough, cough up blood, or have a change in your usual mucus (if you have chronic lung disease). °· You develop sore muscles or a stiff neck. °Document Released: 07/28/2000 Document Revised: 04/26/2011 Document Reviewed: 05/09/2013 °ExitCare® Patient Information ©2015 [employer]. This information is not intended to replace advice given to you by your health care provider. Make sure you discuss any questions you have with your health care provider. °Urinary Tract Infection °Urinary tract infections (UTIs) can develop anywhere along your urinary tract. Your urinary tract is your body's drainage system for removing wastes and extra water. Your urinary tract includes two kidneys, two ureters, a bladder, and  a urethra. Your kidneys are a pair of bean-shaped organs. Each kidney is about the size of your fist. They are located below your ribs, one on each side of your spine. °CAUSES °Infections are caused by microbes, which are microscopic organisms, including fungi, viruses, and bacteria. These organisms are so small that they can only be seen through a microscope. Bacteria are the microbes that most commonly cause UTIs. °SYMPTOMS  °Symptoms of UTIs may vary by age and gender of the patient and by the location of the infection. Symptoms in young women typically include a frequent and intense urge to urinate and a painful, burning feeling in the bladder or urethra during urination. Older women and men are more likely to be tired, shaky, and weak and have muscle aches and abdominal pain. A fever may mean the infection is in your kidneys. Other symptoms of a kidney infection include pain in your back or sides below the ribs, nausea, and vomiting. °DIAGNOSIS °To diagnose a UTI, your caregiver will ask you about your symptoms. Your caregiver also will ask to provide a urine sample. The urine sample will be tested for bacteria and white blood cells. White blood cells are made by your body to help fight infection. °TREATMENT  °Typically, UTIs can be treated with medication. Because most UTIs are caused by a bacterial infection, they usually   can be treated with the use of antibiotics. The choice of antibiotic and length of treatment depend on your symptoms and the type of bacteria causing your infection. °HOME CARE INSTRUCTIONS °· If you were prescribed antibiotics, take them exactly as your caregiver instructs you. Finish the medication even if you feel better after you have only taken some of the medication. °· Drink enough water and fluids to keep your urine clear or pale yellow. °· Avoid caffeine, tea, and carbonated beverages. They tend to irritate your bladder. °· Empty your bladder often. Avoid holding urine for long  periods of time. °· Empty your bladder before and after sexual intercourse. °· After a bowel movement, women should cleanse from front to back. Use each tissue only once. °SEEK MEDICAL CARE IF:  °· You have back pain. °· You develop a fever. °· Your symptoms do not begin to resolve within 3 days. °SEEK IMMEDIATE MEDICAL CARE IF:  °· You have severe back pain or lower abdominal pain. °· You develop chills. °· You have nausea or vomiting. °· You have continued burning or discomfort with urination. °MAKE SURE YOU:  °· Understand these instructions. °· Will watch your condition. °· Will get help right away if you are not doing well or get worse. °Document Released: 11/11/2004 Document Revised: 08/03/2011 Document Reviewed: 03/12/2011 °ExitCare® Patient Information ©2015 [employer]. This information is not intended to replace advice given to you by your health care provider. Make sure you discuss any questions you have with your health care provider. ° °

## 2014-01-07 NOTE — Progress Notes (Signed)
   Subjective:    Patient ID: Chelsey Walker, female    DOB: 08/10/1980, 33 y.o.   MRN: [id number]  HPI CO head congestion, drainage, chills.No cough, sob, or cp. Also has burning and frequency on urination. No flank pain and no hx of previous utis.   Review of Systems Hypothyroid Dr. Talmage NapBalan    Objective:   Physical Exam  Constitutional: She is oriented to person, place, and time. She appears well-developed and well-nourished. No distress.  HENT:  Head: Normocephalic.  Right Ear: External ear normal.  Left Ear: External ear normal.  Nose: Mucosal edema and rhinorrhea present. No sinus tenderness. No epistaxis. Right sinus exhibits no maxillary sinus tenderness and no frontal sinus tenderness. Left sinus exhibits no frontal sinus tenderness.  Mouth/Throat: Oropharynx is clear and moist.  Eyes: Conjunctivae and EOM are normal. Pupils are equal, round, and reactive to light.  Neck: Normal range of motion. Neck supple.  Cardiovascular: Normal rate, regular rhythm and normal heart sounds.   Pulmonary/Chest: Effort normal and breath sounds normal.  Abdominal: Soft. Bowel sounds are normal. She exhibits no distension and no mass. There is tenderness. There is no rebound, no guarding and no CVA tenderness.  Musculoskeletal: Normal range of motion.  Lymphadenopathy:    She has no cervical adenopathy.  Neurological: She is alert and oriented to person, place, and time. She exhibits normal muscle tone.  Psychiatric: She has a normal mood and affect.    Results for orders placed or performed in visit on 01/07/14  POCT urinalysis dipstick  Result Value Ref Range   Color, UA Orange    Clarity, UA hazy    Glucose, UA 100    Bilirubin, UA moderate    Ketones, UA trace    Spec Grav, UA 1.020    Blood, UA small    pH, UA 5.0    Protein, UA 100    Urobilinogen, UA 2.0    Nitrite, UA positive    Leukocytes, UA large (3+)   POCT UA - Microscopic Only  Result Value Ref Range   WBC, Ur,  HPF, POC 20-25    RBC, urine, microscopic 7-10    Bacteria, U Microscopic trace    Mucus, UA neg    Epithelial cells, urine per micros 0-1    Crystals, Ur, HPF, POC neg    Casts, Ur, LPF, POC renal tubular    Yeast, UA neg          Assessment & Plan:  UTI/URI Cipro/On BCPs

## 2014-03-07 ENCOUNTER — Ambulatory Visit (INDEPENDENT_AMBULATORY_CARE_PROVIDER_SITE_OTHER): Payer: PRIVATE HEALTH INSURANCE | Admitting: Emergency Medicine

## 2014-03-07 VITALS — BP 118/82 | HR 90 | Temp 98.3°F | Resp 18 | Ht 64.25 in | Wt 240.0 lb

## 2014-03-07 DIAGNOSIS — J0140 Acute pansinusitis, unspecified: Secondary | ICD-10-CM

## 2014-03-07 DIAGNOSIS — J209 Acute bronchitis, unspecified: Secondary | ICD-10-CM

## 2014-03-07 MED ORDER — PSEUDOEPHEDRINE-GUAIFENESIN ER 60-600 MG PO TB12: 1.0000 | ORAL_TABLET | Freq: Two times a day (BID) | ORAL | Status: DC

## 2014-03-07 MED ORDER — AMOXICILLIN-POT CLAVULANATE 875-125 MG PO TABS: 1.0000 | ORAL_TABLET | Freq: Two times a day (BID) | ORAL | Status: DC

## 2014-03-07 MED ORDER — HYDROCOD POLST-CHLORPHEN POLST 10-8 MG/5ML PO LQCR: 5.0000 mL | Freq: Two times a day (BID) | ORAL | Status: DC | PRN

## 2014-03-07 NOTE — Patient Instructions (Signed)

## 2014-03-07 NOTE — Progress Notes (Signed)
[facility] [address], [place] [address] [phone]  Date:  03/07/2014   Name:  Chelsey Walker   DOB:  02/15/1980   MRN:  [id number]  PCP:  Dorisann FramesBALAN,BINDUBAL, MD    Chief Complaint: Sore Throat; Tinnitus; and Nasal Congestion   History of Present Illness:  Chelsey Walker is a 34 y.o. very pleasant female patient who presents with the following:  Patient ill for past week with purulent nasal drainage and congestion.  Has pressure in sinuses Post nasal drainage Pressure and decreased hearing in right ear. No response to drops Sore throat Has a cough productive purulent sputum No wheezing or shortness of breath No fever or chills No improvement with over the counter medications or other home remedies.  Denies other complaint or health concern today.  .    Patient Active Problem List   Diagnosis Date Noted  . 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 Hypertrophy of tonsils alone 06/07/2013  . Dysphagia, unspecified(787.20) 06/07/2013    Past Medical History  Diagnosis Date  . Seizures   . Anxiety   . Hyperthyroidism   . PCOS (polycystic ovarian syndrome)   . Thyroid disease   . GERD (gastroesophageal reflux disease)   . GNFAOZHY(865.7Headache(784.0)     Past Surgical History  Procedure Laterality Date  . Knee surgery  2003  . Cholecystectomy N/A 11/12/2013    Procedure: LAPAROSCOPIC CHOLECYSTECTOMY WITH INTRAOPERATIVE CHOLANGIOGRAM;  Surgeon: Chevis PrettyPaul Toth III, MD;  Location: [facility] OR;  Service: General;  Laterality: N/A;    History  Substance Use Topics  . Smoking status: Former Smoker -- 0.50 packs/day    Types: Cigarettes  . Smokeless tobacco: Never Used     Comment: Quit smoking cigarettes in 2014  . Alcohol Use: No    Family History  Problem Relation Age of Onset  . Seizures Mother   . Mental illness Mother   . Diabetes Father   . Hypertension Father   . Mental retardation Sister   . Diabetes Maternal Grandmother   . Diabetes Paternal  Grandmother   . Hypertension Paternal Grandmother     No Known Allergies  Medication list has been reviewed and updated.  Current Outpatient Prescriptions on File Prior to Visit  Medication Sig Dispense Refill  . cholecalciferol (VITAMIN D) 1000 UNITS tablet Take 1,000 Units by mouth daily.    Marland Kitchen. levothyroxine (SYNTHROID, LEVOTHROID) 137 MCG tablet Take 137 mcg by mouth daily before breakfast.    . Multiple Vitamins-Minerals (MULTIVITAMIN WITH MINERALS) tablet Take 1 tablet by mouth daily.    . norgestrel-ethinyl estradiol (LO/OVRAL,CRYSELLE) 0.3-30 MG-MCG tablet Take 1 tablet by mouth daily.    . ciprofloxacin (CIPRO) 500 MG tablet Take 1 tablet (500 mg total) by mouth 2 (two) times daily. (Patient not taking: Reported on 03/07/2014) 20 tablet 0  . HYDROcodone-acetaminophen (NORCO/VICODIN) 5-325 MG per tablet Take 1 tablet by mouth every 4 (four) hours as needed. (Patient not taking: Reported on 01/07/2014) 15 tablet 0  . omeprazole (PRILOSEC) 20 MG capsule Take 20 mg by mouth daily.    Marland Kitchen. oxyCODONE-acetaminophen (ROXICET) 5-325 MG per tablet Take 1-2 tablets by mouth every 4 (four) hours as needed. (Patient not taking: Reported on 01/07/2014) 50 tablet 0  . promethazine (PHENERGAN) 25 MG tablet Take 1 tablet (25 mg total) by mouth every 6 (six) hours as needed for nausea or vomiting. (Patient not taking: Reported on 01/07/2014) 15 tablet 0   No current facility-administered medications on file  prior to visit.    Review of Systems:  As per HPI, otherwise negative.    Physical Examination: Filed Vitals:   03/07/14 1422  BP: 118/82  Pulse: 90  Temp: 98.3 F (36.8 C)  Resp: 18   Filed Vitals:   03/07/14 1422  Height: 5' 4.25" (1.632 m)  Weight: 240 lb (108.863 kg)   Body mass index is 40.87 kg/(m^2). Ideal Body Weight: Weight in (lb) to have BMI = 25: 146.5  GEN: obese, NAD, Non-toxic, A & O x 3 HEENT: Atraumatic, Normocephalic. Neck supple. No masses, No LAD. Ears and Nose:  No external deformity. CV: RRR, No M/G/R. No JVD. No thrill. No extra heart sounds. PULM: CTA B, no wheezes, crackles, rhonchi. No retractions. No resp. distress. No accessory muscle use. ABD: S, NT, ND, +BS. No rebound. No HSM. EXTR: No c/c/e NEURO Normal gait.  PSYCH: Normally interactive. Conversant. Not depressed or anxious appearing.  Calm demeanor.    .  Assessment and Plan: Sinusitis Bronchitis augmentin mucinex tussionex  Signed,  Phillips Odor, MD

## 2015-01-17 ENCOUNTER — Ambulatory Visit (INDEPENDENT_AMBULATORY_CARE_PROVIDER_SITE_OTHER): Payer: PRIVATE HEALTH INSURANCE | Admitting: Family Medicine

## 2015-01-17 VITALS — BP 110/60 | HR 78 | Temp 98.0°F | Resp 16 | Ht 64.0 in | Wt 234.6 lb

## 2015-01-17 DIAGNOSIS — J0100 Acute maxillary sinusitis, unspecified: Secondary | ICD-10-CM | POA: Diagnosis not present

## 2015-01-17 DIAGNOSIS — K0889 Other specified disorders of teeth and supporting structures: Secondary | ICD-10-CM

## 2015-01-17 MED ORDER — AMOXICILLIN-POT CLAVULANATE 875-125 MG PO TABS: 1.0000 | ORAL_TABLET | Freq: Two times a day (BID) | ORAL | Status: DC

## 2015-01-17 MED ORDER — HYDROCODONE-ACETAMINOPHEN 5-325 MG PO TABS: 1.0000 | ORAL_TABLET | Freq: Four times a day (QID) | ORAL | Status: DC | PRN

## 2015-01-17 NOTE — Progress Notes (Signed)
Subjective:  This chart was scribed for Chelsey Staggers, MD by Broadus John, Medical Scribe. This patient was seen in Room 12 and the patient's care was started at 9:30 AM.   Patient ID: Chelsey Walker, female    DOB: [birth year], 33 y.o.   MRN: [id number]  Chief Complaint  Patient presents with  . Dental Pain    left side tooth pain, since wed.  . congestion    x 2 weeks  . Cough    x 2 weeks  . Generalized Body Aches    x 2 weeks  . itchy throat    x 2 weeks    HPI HPI Comments: Chelsey Walker is a 34 y.o. female who presents to [facility] complaining of congestion of yellow to green color mucus, starting onset 1 week ago.  Pt has associated symptoms of cough, rhinorrhea, sore throat, myalgia, headaches. She notes taking Cold& Flu OTC medication, however she found very mild relief with it. Pt denies fever, or possible sick contact. Pt reports no antibiotics allergies. Pt reports experiencing no side effects with using narcotic medication.   Pt reports a history of seizures. She indicates that she does not currently take any medications for it. She notes that her last episode was about 5 years ago.   Pt  Also reports a tooth ache on the left lower part of the mouth. She reports being able to drink fluids fine. Pt denies otalgia associated with the symptoms.   Pt works at a nursing home.    Patient Active Problem List   Diagnosis Date Noted  . 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 Hypertrophy of tonsils alone 06/07/2013  . Dysphagia, unspecified(787.20) 06/07/2013   Past Medical History  Diagnosis Date  . Seizures (HCC)   . Anxiety   . Hyperthyroidism   . PCOS (polycystic ovarian syndrome)   . Thyroid disease   . GERD (gastroesophageal reflux disease)   . [id number](478.2)    Past Surgical History  Procedure Laterality Date  . Knee surgery  2003  . Cholecystectomy N/A 11/12/2013    Procedure: LAPAROSCOPIC CHOLECYSTECTOMY WITH INTRAOPERATIVE  CHOLANGIOGRAM;  Surgeon: Chevis Pretty III, MD;  Location: [facility] OR;  Service: General;  Laterality: N/A;   No Known Allergies Prior to Admission medications   Medication Sig Start Date End Date Taking? Authorizing Provider  levothyroxine (SYNTHROID, LEVOTHROID) 137 MCG tablet Take 137 mcg by mouth daily before breakfast.   Yes Historical Provider, MD  Multiple Vitamins-Minerals (MULTIVITAMIN WITH MINERALS) tablet Take 1 tablet by mouth daily.   Yes Historical Provider, MD  norgestrel-ethinyl estradiol (LO/OVRAL,CRYSELLE) 0.3-30 MG-MCG tablet Take 1 tablet by mouth daily.   Yes Historical Provider, MD  chlorpheniramine-HYDROcodone (TUSSIONEX PENNKINETIC ER) 10-8 MG/5ML LQCR Take 5 mLs by mouth every 12 (twelve) hours as needed. Patient not taking: Reported on 01/17/2015 03/07/14   Carmelina Dane, MD  cholecalciferol (VITAMIN D) 1000 UNITS tablet Take 1,000 Units by mouth daily.    Historical Provider, MD  HYDROcodone-acetaminophen (NORCO/VICODIN) 5-325 MG per tablet Take 1 tablet by mouth every 4 (four) hours as needed. Patient not taking: Reported on 01/07/2014 10/05/13   Layla Maw Ward, DO  omeprazole (PRILOSEC) 20 MG capsule Take 20 mg by mouth daily.    Historical Provider, MD  oxyCODONE-acetaminophen (ROXICET) 5-325 MG per tablet Take 1-2 tablets by mouth every 4 (four) hours as needed. Patient not taking: Reported on 01/07/2014 11/12/13   Griselda Miner, MD  pseudoephedrine-guaifenesin (  MUCINEX D) 60-600 MG per tablet Take 1 tablet by mouth every 12 (twelve) hours. Patient not taking: Reported on 01/17/2015 03/07/14 03/07/15  Carmelina Dane, MD   Social History   Social History  . Marital Status: Single    Spouse Name: N/A  . Number of Children: N/A  . Years of Education: N/A   Occupational History  . Not on file.   Social History Main Topics  . Smoking status: Former Smoker -- 0.50 packs/day    Types: Cigarettes  . Smokeless tobacco: Never Used     Comment: Quit smoking cigarettes  in 2014  . Alcohol Use: No  . Drug Use: No  . Sexual Activity: Yes    Birth Control/ Protection: None   Other Topics Concern  . Not on file   Social History Narrative    Review of Systems  Constitutional: Negative for fever.  HENT: Positive for congestion, dental problem, rhinorrhea and sore throat. Negative for ear pain and trouble swallowing.   Respiratory: Positive for cough.   Musculoskeletal: Positive for myalgias.      Objective:   Physical Exam  Constitutional: She is oriented to person, place, and time. She appears well-developed and well-nourished. No distress.  HENT:  Head: Normocephalic and atraumatic.  Right Ear: Hearing, tympanic membrane, external ear and ear canal normal.  Left Ear: Hearing, tympanic membrane, external ear and ear canal normal.  Nose: Nose normal.  Mouth/Throat: Oropharynx is clear and moist. No oropharyngeal exudate.  Left maxillary sinus tender.  Tender along the lateral jaw line in front of the ear. Slight gum irritation on the posterior aspect along the lower molar on the left side. No apparent periodontal abscess. No other oral lesions.Tender along the posterior aspect of the left lower distal 3rd molar.  Eyes: Conjunctivae and EOM are normal. Pupils are equal, round, and reactive to light.  Neck: Neck supple.  Cardiovascular: Normal rate, regular rhythm, normal heart sounds and intact distal pulses.   No murmur heard. Pulmonary/Chest: Effort normal and breath sounds normal. No respiratory distress. She has no wheezes. She has no rhonchi.  Lymphadenopathy:  No lypmphadenopathy on the neck.   Neurological: She is alert and oriented to person, place, and time. No cranial nerve deficit.  Skin: Skin is warm and dry. No rash noted.  Psychiatric: She has a normal mood and affect. Her behavior is normal.  Nursing note and vitals reviewed.   Filed Vitals:   01/17/15 0920  BP: 110/60  Pulse: 78  Temp: 98 F (36.7 C)  TempSrc: Oral  Resp:  16  Height:  (1.626 m)  Weight: 234 lb 9.6 oz (106.414 kg)  SpO2: 96%      Assessment & Plan:   [name] is a 34 y.o. female Acute maxillary sinusitis, recurrence not specified - Plan: amoxicillin-clavulanate (AUGMENTIN) 875-125 MG tablet  Pain, dental - Plan: amoxicillin-clavulanate (AUGMENTIN) 875-125 MG tablet, HYDROcodone-acetaminophen (NORCO/VICODIN) 5-325 MG tablet  Possible combination of acute maxillary sinusitis on left, with interrupting/irritation of gumline from third molar on the lower left. Afebrile.   -Start Augmentin. Other treatment per handout. RTC precautions.  -Hydrocodone provided for short-term pain control if needed, but advised to follow-up with dentist. Initially planned for dental eval Monday, but did find a practice locally that is open today. She will go to [facility] this morning for evaluation.  Meds ordered this encounter  Medications  . amoxicillin-clavulanate (AUGMENTIN) 875-125 MG tablet    Sig: Take 1 tablet by mouth  2 (two) times daily.    Dispense:  20 tablet    Refill:  0  . HYDROcodone-acetaminophen (NORCO/VICODIN) 5-325 MG tablet    Sig: Take 1 tablet by mouth every 6 (six) hours as needed for moderate pain.    Dispense:  15 tablet    Refill:  0   Patient Instructions  You may have pain due to both a sinus infection and the molar/wisdom tooth pushing on your gum. I do not see any infection around the tooth for now, but if you notice gum swelling, fevers, or worsening prior to seeing dentist next week - return here or emergency room.   Start augmentin as discussed. Ibuprofen over the counter, then hydrocodone if needed for more severe pain.   Call your dentist Monday morning to be seen.   Sinusitis, Adult Sinusitis is redness, soreness, and inflammation of the paranasal sinuses. Paranasal sinuses are air pockets within the bones of your face. They are located beneath your eyes, in the middle of your forehead, and above your  eyes. In healthy paranasal sinuses, mucus is able to drain out, and air is able to circulate through them by way of your nose. However, when your paranasal sinuses are inflamed, mucus and air can become trapped. This can allow bacteria and other germs to grow and cause infection. Sinusitis can develop quickly and last only a short time (acute) or continue over a long period (chronic). Sinusitis that lasts for more than 12 weeks is considered chronic. CAUSES Causes of sinusitis include:  Allergies.  Structural abnormalities, such as displacement of the cartilage that separates your nostrils (deviated septum), which can decrease the air flow through your nose and sinuses and affect sinus drainage.  Functional abnormalities, such as when the small hairs (cilia) that line your sinuses and help remove mucus do not work properly or are not present. SIGNS AND SYMPTOMS Symptoms of acute and chronic sinusitis are the same. The primary symptoms are pain and pressure around the affected sinuses. Other symptoms include:  Upper toothache.  Earache.  Headache.  Bad breath.  Decreased sense of smell and taste.  A cough, which worsens when you are lying flat.  Fatigue.  Fever.  Thick drainage from your nose, which often is green and may contain pus (purulent).  Swelling and warmth over the affected sinuses. DIAGNOSIS Your health care provider will perform a physical exam. During your exam, your health care provider may perform any of the following to help determine if you have acute sinusitis or chronic sinusitis:  Look in your nose for signs of abnormal growths in your nostrils (nasal polyps).  Tap over the affected sinus to check for signs of infection.  View the inside of your sinuses using an imaging device that has a light attached (endoscope). If your health care provider suspects that you have chronic sinusitis, one or more of the following tests may be recommended:  Allergy  tests.  Nasal culture. A sample of mucus is taken from your nose, sent to a lab, and screened for bacteria.  Nasal cytology. A sample of mucus is taken from your nose and examined by your health care provider to determine if your sinusitis is related to an allergy. TREATMENT Most cases of acute sinusitis are related to a viral infection and will resolve on their own within 10 days. Sometimes, medicines are prescribed to help relieve symptoms of both acute and chronic sinusitis. These may include pain medicines, decongestants, nasal steroid sprays, or saline sprays. However,  for sinusitis related to a bacterial infection, your health care provider will prescribe antibiotic medicines. These are medicines that will help kill the bacteria causing the infection. Rarely, sinusitis is caused by a fungal infection. In these cases, your health care provider will prescribe antifungal medicine. For some cases of chronic sinusitis, surgery is needed. Generally, these are cases in which sinusitis recurs more than 3 times per year, despite other treatments. HOME CARE INSTRUCTIONS  Drink plenty of water. Water helps thin the mucus so your sinuses can drain more easily.  Use a humidifier.  Inhale steam 3-4 times a day (for example, sit in the bathroom with the shower running).  Apply a warm, moist washcloth to your face 3-4 times a day, or as directed by your health care provider.  Use saline nasal sprays to help moisten and clean your sinuses.  Take medicines only as directed by your health care provider.  If you were prescribed either an antibiotic or antifungal medicine, finish it all even if you start to feel better. SEEK IMMEDIATE MEDICAL CARE IF:  You have increasing pain or severe headaches.  You have nausea, vomiting, or drowsiness.  You have swelling around your face.  You have vision problems.  You have a stiff neck.  You have difficulty breathing.   This information is not intended  to replace advice given to you by your health care provider. Make sure you discuss any questions you have with your health care provider.   Document Released: 02/01/2005 Document Revised: 02/22/2014 Document Reviewed: 02/16/2011 Elsevier Interactive Patient Education 2016 [employer]   Dental Pain Dental pain may be caused by many things, including:  Tooth decay (cavities or caries). Cavities expose the nerve of your tooth to air and hot or cold temperatures. This can cause pain or discomfort.  Abscess or infection. A dental abscess is a collection of infected pus from a bacterial infection in the inner part of the tooth (pulp). It usually occurs at the end of the tooth's root.  Injury.  An unknown reason (idiopathic). Your pain may be mild or severe. It may only occur when:  You are chewing.  You are exposed to hot or cold temperature.  You are eating or drinking sugary foods or beverages, such as soda or candy. Your pain may also be constant. HOME CARE INSTRUCTIONS Watch your dental pain for any changes. The following actions may help to lessen any discomfort that you are feeling:  Take medicines only as directed by your dentist.  If you were prescribed an antibiotic medicine, finish all of it even if you start to feel better.  Keep all follow-up visits as directed by your dentist. This is important.  Do not apply heat to the outside of your face.  Rinse your mouth or gargle with salt water if directed by your dentist. This helps with pain and swelling.  You can make salt water by adding  tsp of salt to 1 cup of warm water.  Apply ice to the painful area of your face:  Put ice in a plastic bag.  Place a towel between your skin and the bag.  Leave the ice on for 20 minutes, 2-3 times per day.  Avoid foods or drinks that cause you pain, such as:  Very hot or very cold foods or drinks.  Sweet or sugary foods or drinks. SEEK MEDICAL CARE IF:  Your pain is not  controlled with medicines.  Your symptoms are worse.  You have new symptoms.  SEEK IMMEDIATE MEDICAL CARE IF:  You are unable to open your mouth.  You are having trouble breathing or swallowing.  You have a fever.  Your face, neck, or jaw is swollen.   This information is not intended to replace advice given to you by your health care provider. Make sure you discuss any questions you have with your health care provider.   Document Released: 02/01/2005 Document Revised: 06/18/2014 Document Reviewed: 01/28/2014 Elsevier Interactive Patient Education [employer]       By signing my name below, I, Rawaa Al Rifaie, attest that this documentation has been prepared under the direction and in the presence of Chelsey Staggers, MD.  Watt Climes Rifaie, Medical Scribe. 01/17/2015.  9:41 AM.  I personally performed the services described in this documentation, which was scribed in my presence. The recorded information has been reviewed and considered, and addended by me as needed.

## 2015-01-17 NOTE — Patient Instructions (Addendum)
You may have pain due to both a sinus infection and the molar/wisdom tooth pushing on your gum. I do not see any infection around the tooth for now, but if you notice gum swelling, fevers, or worsening prior to seeing dentist next week - return here or emergency room.   Start augmentin as discussed. Ibuprofen over the counter, then hydrocodone if needed for more severe pain.   Call your dentist Monday morning to be seen.   Sinusitis, Adult Sinusitis is redness, soreness, and inflammation of the paranasal sinuses. Paranasal sinuses are air pockets within the bones of your face. They are located beneath your eyes, in the middle of your forehead, and above your eyes. In healthy paranasal sinuses, mucus is able to drain out, and air is able to circulate through them by way of your nose. However, when your paranasal sinuses are inflamed, mucus and air can become trapped. This can allow bacteria and other germs to grow and cause infection. Sinusitis can develop quickly and last only a short time (acute) or continue over a long period (chronic). Sinusitis that lasts for more than 12 weeks is considered chronic. CAUSES Causes of sinusitis include:  Allergies.  Structural abnormalities, such as displacement of the cartilage that separates your nostrils (deviated septum), which can decrease the air flow through your nose and sinuses and affect sinus drainage.  Functional abnormalities, such as when the small hairs (cilia) that line your sinuses and help remove mucus do not work properly or are not present. SIGNS AND SYMPTOMS Symptoms of acute and chronic sinusitis are the same. The primary symptoms are pain and pressure around the affected sinuses. Other symptoms include:  Upper toothache.  Earache.  Headache.  Bad breath.  Decreased sense of smell and taste.  A cough, which worsens when you are lying flat.  Fatigue.  Fever.  Thick drainage from your nose, which often is green and may  contain pus (purulent).  Swelling and warmth over the affected sinuses. DIAGNOSIS Your health care provider will perform a physical exam. During your exam, your health care provider may perform any of the following to help determine if you have acute sinusitis or chronic sinusitis:  Look in your nose for signs of abnormal growths in your nostrils (nasal polyps).  Tap over the affected sinus to check for signs of infection.  View the inside of your sinuses using an imaging device that has a light attached (endoscope). If your health care provider suspects that you have chronic sinusitis, one or more of the following tests may be recommended:  Allergy tests.  Nasal culture. A sample of mucus is taken from your nose, sent to a lab, and screened for bacteria.  Nasal cytology. A sample of mucus is taken from your nose and examined by your health care provider to determine if your sinusitis is related to an allergy. TREATMENT Most cases of acute sinusitis are related to a viral infection and will resolve on their own within 10 days. Sometimes, medicines are prescribed to help relieve symptoms of both acute and chronic sinusitis. These may include pain medicines, decongestants, nasal steroid sprays, or saline sprays. However, for sinusitis related to a bacterial infection, your health care provider will prescribe antibiotic medicines. These are medicines that will help kill the bacteria causing the infection. Rarely, sinusitis is caused by a fungal infection. In these cases, your health care provider will prescribe antifungal medicine. For some cases of chronic sinusitis, surgery is needed. Generally, these are cases in which  sinusitis recurs more than 3 times per year, despite other treatments. HOME CARE INSTRUCTIONS  Drink plenty of water. Water helps thin the mucus so your sinuses can drain more easily.  Use a humidifier.  Inhale steam 3-4 times a day (for example, sit in the bathroom with  the shower running).  Apply a warm, moist washcloth to your face 3-4 times a day, or as directed by your health care provider.  Use saline nasal sprays to help moisten and clean your sinuses.  Take medicines only as directed by your health care provider.  If you were prescribed either an antibiotic or antifungal medicine, finish it all even if you start to feel better. SEEK IMMEDIATE MEDICAL CARE IF:  You have increasing pain or severe headaches.  You have nausea, vomiting, or drowsiness.  You have swelling around your face.  You have vision problems.  You have a stiff neck.  You have difficulty breathing.   This information is not intended to replace advice given to you by your health care provider. Make sure you discuss any questions you have with your health care provider.   Document Released: 02/01/2005 Document Revised: 02/22/2014 Document Reviewed: 02/16/2011 Elsevier Interactive Patient Education 2016 Else[employer] Inc[employer]   Dental Pain Dental pain may be caused by many things, including:  Tooth decay (cavities or caries). Cavities expose the nerve of your tooth to air and hot or cold temperatures. This can cause pain or discomfort.  Abscess or infection. A dental abscess is a collection of infected pus from a bacterial infection in the inner part of the tooth (pulp). It usually occurs at the end of the tooth's root.  Injury.  An unknown reason (idiopathic). Your pain may be mild or severe. It may only occur when:  You are chewing.  You are exposed to hot or cold temperature.  You are eating or drinking sugary foods or beverages, such as soda or candy. Your pain may also be constant. HOME CARE INSTRUCTIONS Watch your dental pain for any changes. The following actions may help to lessen any discomfort that you are feeling:  Take medicines only as directed by your dentist.  If you were prescribed an antibiotic medicine, finish all of it even if you start to feel  better.  Keep all follow-up visits as directed by your dentist. This is important.  Do not apply heat to the outside of your face.  Rinse your mouth or gargle with salt water if directed by your dentist. This helps with pain and swelling.  You can make salt water by adding  tsp of salt to 1 cup of warm water.  Apply ice to the painful area of your face:  Put ice in a plastic bag.  Place a towel between your skin and the bag.  Leave the ice on for 20 minutes, 2-3 times per day.  Avoid foods or drinks that cause you pain, such as:  Very hot or very cold foods or drinks.  Sweet or sugary foods or drinks. SEEK MEDICAL CARE IF:  Your pain is not controlled with medicines.  Your symptoms are worse.  You have new symptoms. SEEK IMMEDIATE MEDICAL CARE IF:  You are unable to open your mouth.  You are having trouble breathing or swallowing.  You have a fever.  Your face, neck, or jaw is swollen.   This information is not intended to replace advice given to you by your health care provider. Make sure you discuss any questions you have with  your health care provider.   Document Released: 02/01/2005 Document Revised: 06/18/2014 Document Reviewed: 01/28/2014 Elsevier Interactive Patient Education [employer] Inc[employer]

## 2016-03-23 IMAGING — CR DG ABDOMEN ACUTE W/ 1V CHEST
3 series · 3 of 3 positions shown · non-contrast
Comparison: None.

CLINICAL DATA: Abdominal pain.  Nausea and vomiting 2 days.

EXAM:
ACUTE ABDOMEN SERIES (ABDOMEN 2 VIEW & CHEST 1 VIEW)

[w chest pa]
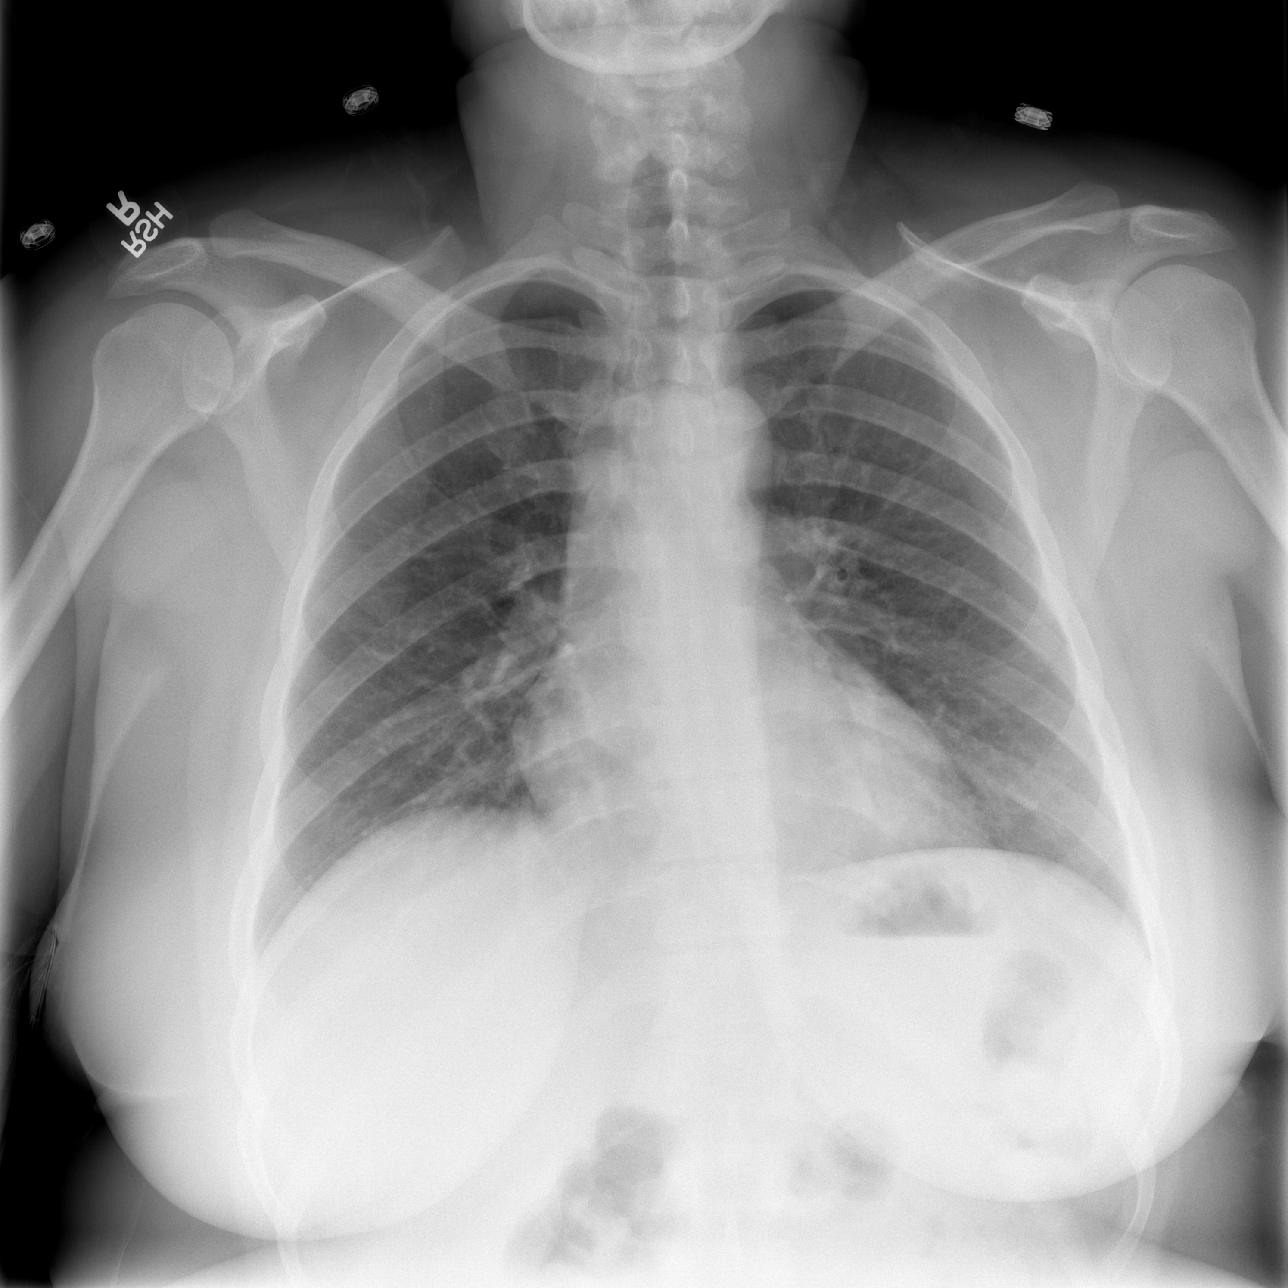

[w abdomen upright]
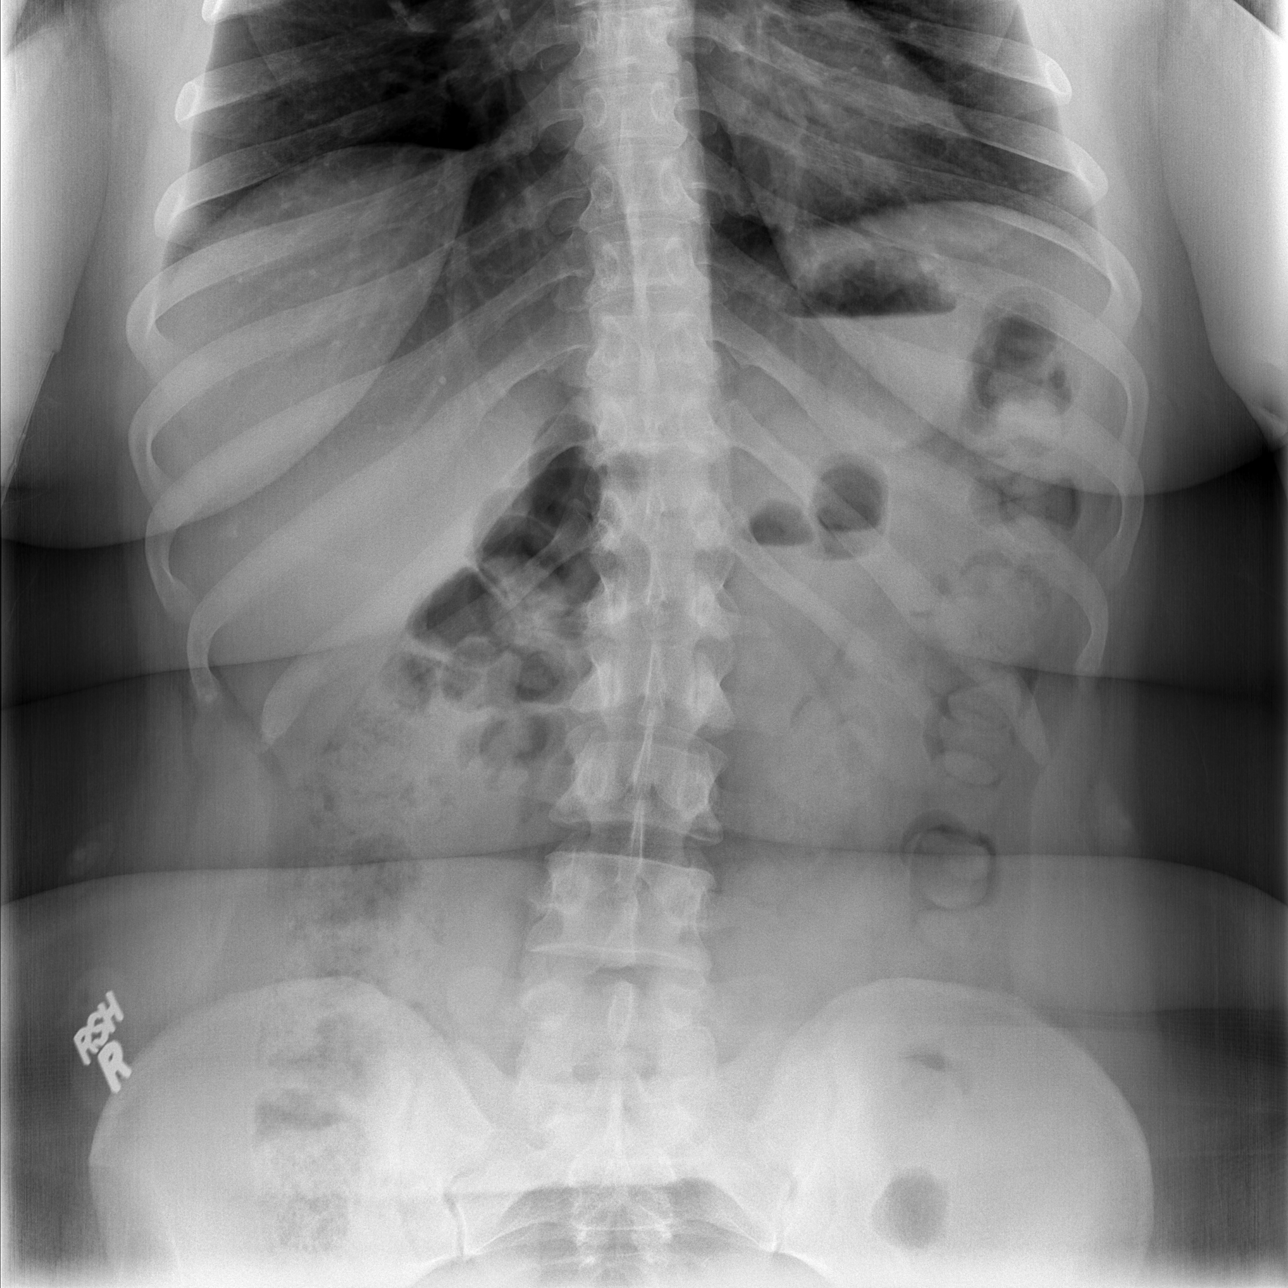

[t abdomen supine]
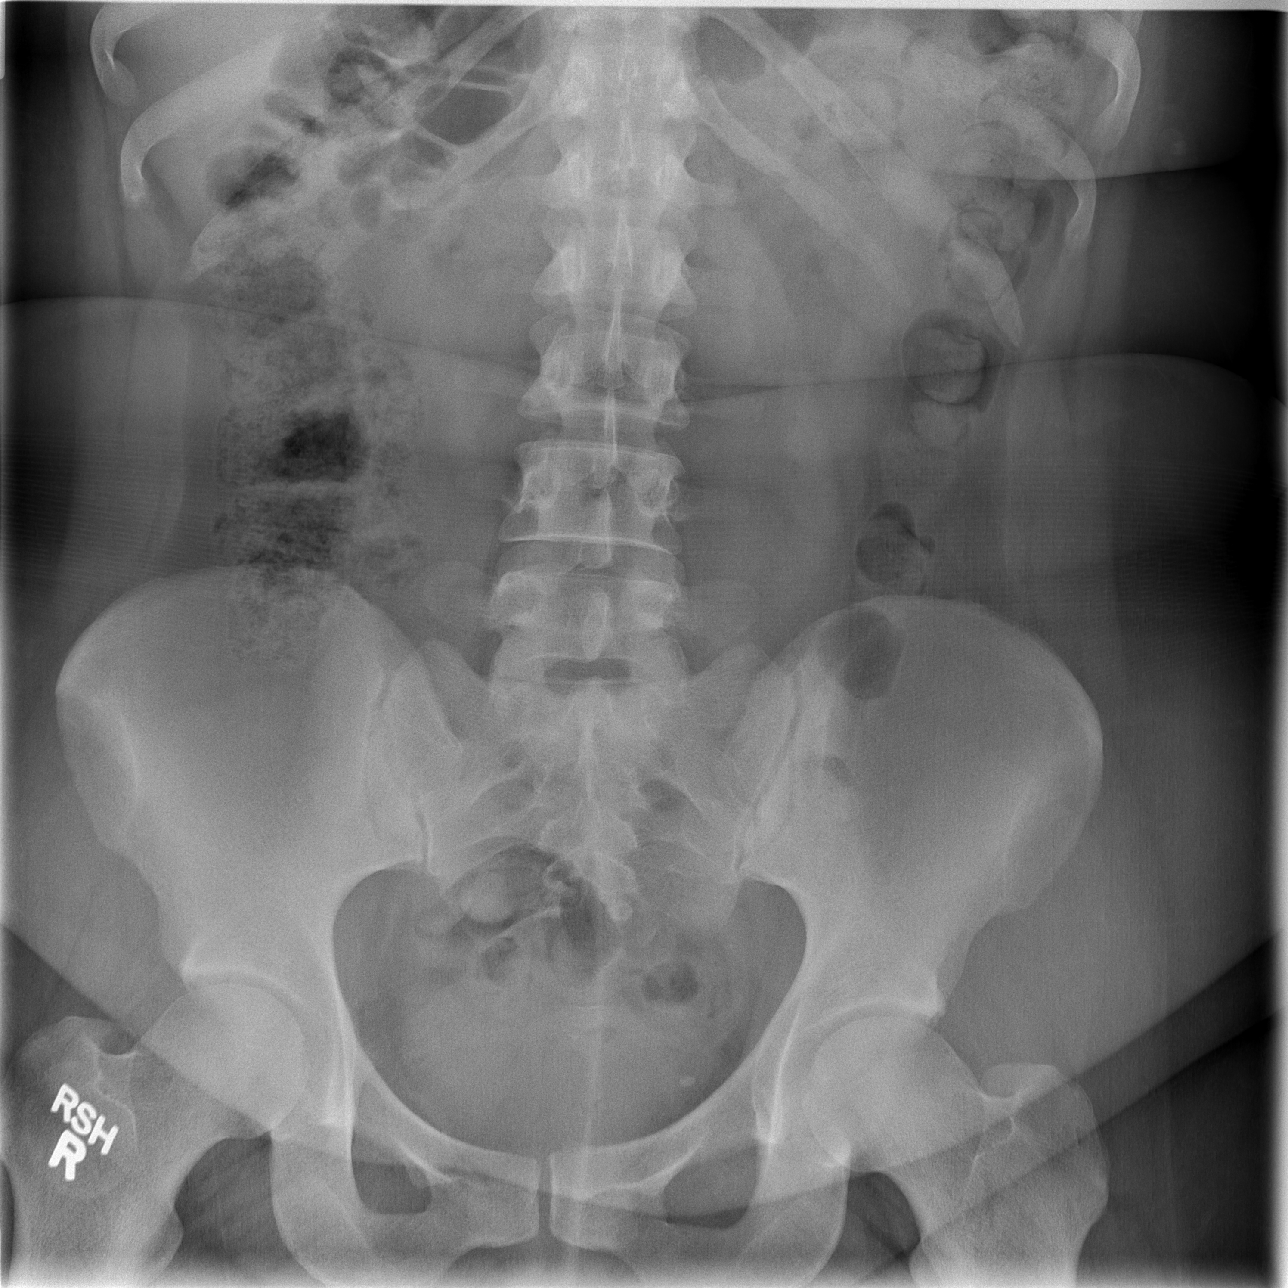

[3 of 3 positions shown; findings below may reference images not displayed]

FINDINGS: Normal heart size.  Clear lungs

No free intraperitoneal gas. No disproportionate dilatation of
bowel. Moderate stool burden in the colon. Probable phleboliths
project over the pelvis. Nonspecific air-fluid levels in transverse
colon. Mild levoscoliosis with the apex at L1-2.
IMPRESSION: No active cardiopulmonary disease.  Nonobstructive bowel gas pattern

## 2017-04-11 ENCOUNTER — Ambulatory Visit: Payer: PRIVATE HEALTH INSURANCE | Admitting: Family Medicine

## 2017-04-11 ENCOUNTER — Encounter: Payer: Self-pay | Admitting: Family Medicine

## 2017-04-11 ENCOUNTER — Ambulatory Visit: Payer: PRIVATE HEALTH INSURANCE | Admitting: Physician Assistant

## 2017-04-11 VITALS — BP 128/98 | HR 92 | Temp 98.5°F | Resp 16 | Ht 64.0 in | Wt 231.2 lb

## 2017-04-11 DIAGNOSIS — R42 Dizziness and giddiness: Secondary | ICD-10-CM

## 2017-04-11 DIAGNOSIS — J111 Influenza due to unidentified influenza virus with other respiratory manifestations: Secondary | ICD-10-CM

## 2017-04-11 DIAGNOSIS — R509 Fever, unspecified: Secondary | ICD-10-CM

## 2017-04-11 DIAGNOSIS — J029 Acute pharyngitis, unspecified: Secondary | ICD-10-CM | POA: Diagnosis not present

## 2017-04-11 LAB — POCT CBC
Granulocyte percent: 68.9 %G (ref 37–80)
HCT, POC: 42.6 % (ref 37.7–47.9)
Hemoglobin: 14.4 g/dL (ref 12.2–16.2)
Lymph, poc: 2.2 (ref 0.6–3.4)
MCH, POC: 29.1 pg (ref 27–31.2)
MCHC: 33.8 g/dL (ref 31.8–35.4)
MCV: 86 fL (ref 80–97)
MID (cbc): 0.6 (ref 0–0.9)
MPV: 7.6 fL (ref 0–99.8)
POC Granulocyte: 6.3 (ref 2–6.9)
POC LYMPH PERCENT: 24.2 %L (ref 10–50)
POC MID %: 6.9 %M (ref 0–12)
Platelet Count, POC: 422 10*3/uL (ref 142–424)
RBC: 4.95 M/uL (ref 4.04–5.48)
RDW, POC: 14.1 %
WBC: 9.1 10*3/uL (ref 4.6–10.2)

## 2017-04-11 LAB — POC INFLUENZA A&B (BINAX/QUICKVUE)
Influenza A, POC: NEGATIVE
Influenza B, POC: NEGATIVE

## 2017-04-11 LAB — POCT RAPID STREP A (OFFICE): Rapid Strep A Screen: NEGATIVE

## 2017-04-11 MED ORDER — IPRATROPIUM BROMIDE 0.03 % NA SOLN: 2.0000 | Freq: Four times a day (QID) | NASAL | 1 refills | Status: DC

## 2017-04-11 MED ORDER — MUCINEX DM MAXIMUM STRENGTH 60-1200 MG PO TB12: 1.0000 | ORAL_TABLET | Freq: Two times a day (BID) | ORAL | 1 refills | Status: DC

## 2017-04-11 MED ORDER — HYDROCODONE-HOMATROPINE 5-1.5 MG/5ML PO SYRP: 5.0000 mL | ORAL_SOLUTION | Freq: Three times a day (TID) | ORAL | 0 refills | Status: DC | PRN

## 2017-04-11 NOTE — Patient Instructions (Addendum)
I recommend frequent warm salt water gargles, hot tea with honey and lemon, rest, and handwashing.  Hot showers or breathing in steam may help loosen the congestion.  Try a netti pot or sinus rinse is also likely to help you feel better and keep this from progressing.     IF you received an x-ray today, you will receive an invoice from [facility]. Please contact [facility] at [phone] with questions or concerns regarding your invoice.   IF you received labwork today, you will receive an invoice from [place]. Please contact [facility] at [phone] with questions or concerns regarding your invoice.   Our billing staff will not be able to assist you with questions regarding bills from these companies.  You will be contacted with the lab results as soon as they are available. The fastest way to get your results is to activate your My Chart account. Instructions are located on the last page of this paperwork. If you have not heard from [place] regarding the results in 2 weeks, please contact this office.      Influenza, Adult Influenza, more commonly known as "the flu," is a viral infection that primarily affects the respiratory tract. The respiratory tract includes organs that help you breathe, such as the lungs, nose, and throat. The flu causes many common cold symptoms, as well as a high fever and body aches. The flu spreads easily from person to person (is contagious). Getting a flu shot (influenza vaccination) every year is the best way to prevent influenza. What are the causes? Influenza is caused by a virus. You can catch the virus by:  Breathing in droplets from an infected person's cough or sneeze.  Touching something that was recently contaminated with the virus and then touching your mouth, nose, or eyes.  What increases the risk? The following factors may make you more likely to get the flu:  Not cleaning your hands frequently with soap and water or  alcohol-based hand sanitizer.  Having close contact with many people during cold and flu season.  Touching your mouth, eyes, or nose without washing or sanitizing your hands first.  Not drinking enough fluids or not eating a healthy diet.  Not getting enough sleep or exercise.  Being under a high amount of stress.  Not getting a yearly (annual) flu shot.  You may be at a higher risk of complications from the flu, such as a severe lung infection (pneumonia), if you:  Are over the age of 33.  Are pregnant.  Have a weakened disease-fighting system (immune system). You may have a weakened immune system if you: ? Have HIV or AIDS. ? Are undergoing chemotherapy. ? Aretaking medicines that reduce the activity of (suppress) the immune system.  Have a long-term (chronic) illness, such as heart disease, kidney disease, diabetes, or lung disease.  Have a liver disorder.  Are obese.  Have anemia.  What are the signs or symptoms? Symptoms of this condition typically last 4-10 days and may include:  Fever.  Chills.  Headache, body aches, or muscle aches.  Sore throat.  Cough.  Runny or congested nose.  Chest discomfort and cough.  Poor appetite.  Weakness or tiredness (fatigue).  Dizziness.  Nausea or vomiting.  How is this diagnosed? This condition may be diagnosed based on your medical history and a physical exam. Your health care provider may do a nose or throat swab test to confirm the diagnosis. How is this treated? If influenza is detected early, you can  be treated with antiviral medicine that can reduce the length of your illness and the severity of your symptoms. This medicine may be given by mouth (orally) or through an IV tube that is inserted in one of your veins. The goal of treatment is to relieve symptoms by taking care of yourself at home. This may include taking over-the-counter medicines, drinking plenty of fluids, and adding humidity to the air in  your home. In some cases, influenza goes away on its own. Severe influenza or complications from influenza may be treated in a hospital. Follow these instructions at home:  Take over-the-counter and prescription medicines only as told by your health care provider.  Use a cool mist humidifier to add humidity to the air in your home. This can make breathing easier.  Rest as needed.  Drink enough fluid to keep your urine clear or pale yellow.  Cover your mouth and nose when you cough or sneeze.  Wash your hands with soap and water often, especially after you cough or sneeze. If soap and water are not available, use hand sanitizer.  Stay home from work or school as told by your health care provider. Unless you are visiting your health care provider, try to avoid leaving home until your fever has been gone for 24 hours without the use of medicine.  Keep all follow-up visits as told by your health care provider. This is important. How is this prevented?  Getting an annual flu shot is the best way to avoid getting the flu. You may get the flu shot in late summer, fall, or winter. Ask your health care provider when you should get your flu shot.  Wash your hands often or use hand sanitizer often.  Avoid contact with people who are sick during cold and flu season.  Eat a healthy diet, drink plenty of fluids, get enough sleep, and exercise regularly. Contact a health care provider if:  You develop new symptoms.  You have: ? Chest pain. ? Diarrhea. ? A fever.  Your cough gets worse.  You produce more mucus.  You feel nauseous or you vomit. Get help right away if:  You develop shortness of breath or difficulty breathing.  Your skin or nails turn a bluish color.  You have severe pain or stiffness in your neck.  You develop a sudden headache or sudden pain in your face or ear.  You cannot stop vomiting. This information is not intended to replace advice given to you by your  health care provider. Make sure you discuss any questions you have with your health care provider. Document Released: 01/30/2000 Document Revised: 07/10/2015 Document Reviewed: 11/26/2014 Elsevier Interactive Patient Education  2017 [employer]

## 2017-04-11 NOTE — Progress Notes (Signed)
Subjective:  By signing my name below, I, Essence Howell, attest that this documentation has been prepared under the direction and in the presence of Norberto Sorenson, MD Electronically Signed: Charline Bills, ED Scribe 04/11/2017 at 9:19 AM.   Patient ID: Chelsey Walker, female    DOB: Sep 18, 1980, 37 y.o.   MRN: [id number]  Chief Complaint  Patient presents with  . Sore Throat    cold/hot chills, HA, dizzy started Saturday    HPI Chelsey Walker is a 37 y.o. female who presents to Primary Care at [facility] complaining of sore throat onset 2 days ago. Pt reports associated symptoms of nasal congestion, rhinorrhea, sneezing, chills, night sweats, mild body aches, frontal HA, dizziness that is exacerbated with positions onset 2 days ago. Denies appetite change, cp, palpitations, sob, cough, nausea, vomiting, diarrhea, changes in urine, neck pain. She did not receive her flu vaccine this season. She did take 1/2 phentermine tab today.  Past Medical History:  Diagnosis Date  . Anxiety   . GERD (gastroesophageal reflux disease)   . Headache(784.0)   . Hyperthyroidism   . PCOS (polycystic ovarian syndrome)   . Seizures (HCC)   . Thyroid disease    Current Outpatient Medications on File Prior to Visit  Medication Sig Dispense Refill  . phentermine (ADIPEX-P) 37.5 MG tablet 1/2 tab po qam  1  . buPROPion (WELLBUTRIN XL) 150 MG 24 hr tablet TK 1 T PO D  4  . levothyroxine (SYNTHROID, LEVOTHROID) 137 MCG tablet Take 137 mcg by mouth daily before breakfast.    . Multiple Vitamins-Minerals (MULTIVITAMIN WITH MINERALS) tablet Take 1 tablet by mouth daily.    . norgestrel-ethinyl estradiol (LO/OVRAL,CRYSELLE) 0.3-30 MG-MCG tablet Take 1 tablet by mouth daily.     No current facility-administered medications on file prior to visit.    No Known Allergies   Past Surgical History:  Procedure Laterality Date  . CHOLECYSTECTOMY N/A 11/12/2013   Procedure: LAPAROSCOPIC CHOLECYSTECTOMY WITH INTRAOPERATIVE  CHOLANGIOGRAM;  Surgeon: Chevis Pretty III, MD;  Location: [facility] OR;  Service: General;  Laterality: N/A;  . KNEE SURGERY  2003   Family History  Problem Relation Age of Onset  . Seizures Mother   . Mental illness Mother   . Diabetes Father   . Hypertension Father   . Mental retardation Sister   . Diabetes Maternal Grandmother   . Diabetes Paternal Grandmother   . Hypertension Paternal Grandmother    Social History   Socioeconomic History  . Marital status: Single    Spouse name: None  . Number of children: None  . Years of education: None  . Highest education level: None  Social Needs  . Financial resource strain: None  . Food insecurity - worry: None  . Food insecurity - inability: None  . Transportation needs - medical: None  . Transportation needs - non-medical: None  Occupational History  . None  Tobacco Use  . Smoking status: Former Smoker    Packs/day: 0.50    Types: Cigarettes  . Smokeless tobacco: Never Used  . Tobacco comment: Quit smoking cigarettes in 2014  Substance and Sexual Activity  . Alcohol use: No  . Drug use: No  . Sexual activity: Yes    Birth control/protection: None  Other Topics Concern  . None  Social History Narrative  . None   Depression screen [facility] 2/9 04/11/2017 01/17/2015  Decreased Interest 0 0  Down, Depressed, Hopeless 0 0  PHQ - 2 Score 0 0  Review of Systems  Constitutional: Positive for chills and diaphoresis. Negative for appetite change.  HENT: Positive for congestion, rhinorrhea, sneezing and sore throat.   Respiratory: Negative for cough and shortness of breath.   Cardiovascular: Negative for chest pain and palpitations.  Gastrointestinal: Negative for diarrhea, nausea and vomiting.  Genitourinary: Negative for difficulty urinating and hematuria.  Musculoskeletal: Positive for myalgias (mild). Negative for neck pain.  Neurological: Positive for dizziness and headaches.      Objective:   Physical Exam  Constitutional:  She is oriented to person, place, and time. She appears well-developed and well-nourished. No distress.  HENT:  Head: Normocephalic and atraumatic.  Right Ear: Tympanic membrane is retracted. A middle ear effusion is present.  Left Ear: Tympanic membrane is retracted.  Nose: Rhinorrhea (slight) present.  Mouth/Throat: Oropharynx is clear and moist and mucous membranes are normal. No oropharyngeal exudate or posterior oropharyngeal erythema.  Eyes: Conjunctivae and EOM are normal.  Neck: Neck supple. No tracheal deviation present.  Cardiovascular: Regular rhythm, S1 normal, S2 normal and normal heart sounds. Tachycardia present.  Pulmonary/Chest: Effort normal and breath sounds normal. No respiratory distress.  Musculoskeletal: Normal range of motion.  Neurological: She is alert and oriented to person, place, and time.  Skin: Skin is warm and dry.  Psychiatric: She has a normal mood and affect. Her behavior is normal.  Nursing note and vitals reviewed.  BP (!) 128/98   Pulse 92   Temp 98.5 F (36.9 C)   Resp 16   Ht 5\' 4"  (1.626 m)   Wt 231 lb 3.2 oz (104.9 kg)   SpO2 99%   BMI 39.69 kg/m     Results for orders placed or performed in visit on 04/11/17  POCT rapid strep A  Result Value Ref Range   Rapid Strep A Screen Negative Negative  POC Influenza A&B(BINAX/QUICKVUE)  Result Value Ref Range   Influenza A, POC Negative Negative   Influenza B, POC Negative Negative  POCT CBC  Result Value Ref Range   WBC 9.1 4.6 - 10.2 K/uL   Lymph, poc 2.2 0.6 - 3.4   POC LYMPH PERCENT 24.2 10 - 50 %L   MID (cbc) 0.6 0 - 0.9   POC MID % 6.9 0 - 12 %M   POC Granulocyte 6.3 2 - 6.9   Granulocyte percent 68.9 37 - 80 %G   RBC 4.95 4.04 - 5.48 M/uL   Hemoglobin 14.4 12.2 - 16.2 g/dL   HCT, POC 16.1 09.6 - 47.9 %   MCV 86.0 80 - 97 fL   MCH, POC 29.1 27 - 31.2 pg   MCHC 33.8 31.8 - 35.4 g/dL   RDW, POC 04.5 %   Platelet Count, POC 422 142 - 424 K/uL   MPV 7.6 0 - 99.8 fL    Orthostatic vital signs negative Lying 158/112        P86 Sitting 141/104       P 96 Standing 148/104   P100 Assessment & Plan:   1. Sore throat   2. Fever and chills   3. Dizziness and giddiness   4. Influenza    Highly suspect influenza or similar viral illness from both hx, exam, cbc.  Symptomatic care, rest, push fluids. Advised no more phentermine until completely better and back to normal activity due to elev BP and HR today.  Then make sure vitals are rechecked while on med.  Orders Placed This Encounter  Procedures  . Orthostatic vital signs  .  POCT rapid strep A  . POC Influenza A&B(BINAX/QUICKVUE)  . POCT CBC    Meds ordered this encounter  Medications  . ipratropium (ATROVENT) 0.03 % nasal spray    Sig: Place 2 sprays into the nose 4 (four) times daily.    Dispense:  30 mL    Refill:  1  . HYDROcodone-homatropine (HYCODAN) 5-1.5 MG/5ML syrup    Sig: Take 5 mLs by mouth every 8 (eight) hours as needed for cough.    Dispense:  120 mL    Refill:  0  . Dextromethorphan-Guaifenesin (MUCINEX DM MAXIMUM STRENGTH) 60-1200 MG TB12    Sig: Take 1 tablet by mouth every 12 (twelve) hours.    Dispense:  14 each    Refill:  1    I personally performed the services described in this documentation, which was scribed in my presence. The recorded information has been reviewed and considered, and addended by me as needed.   Norberto SorensonEva Stpehen Petitjean, M.D.  Primary Care at [facility]  [place] [address] [place], [place] [address] [phone] phone [phone] fax  04/12/17 6:23 PM

## 2020-07-03 ENCOUNTER — Telehealth: Payer: Self-pay | Admitting: Internal Medicine

## 2020-07-03 NOTE — Telephone Encounter (Signed)
Received a new hem referral from Mcneil Sober, FNP for thrombocytosis. Ms. Diffee returned my call and has been scheduled to see Dr. Arbutus Ped on 5/23 at 11:45am w/labs at 11:15am. Pt aware to arrive 20 minutes early.

## 2020-07-07 ENCOUNTER — Inpatient Hospital Stay: Payer: BC Managed Care – PPO

## 2020-07-07 ENCOUNTER — Encounter: Payer: Self-pay | Admitting: Internal Medicine

## 2020-07-07 ENCOUNTER — Encounter: Payer: Self-pay | Admitting: Medical Oncology

## 2020-07-07 ENCOUNTER — Inpatient Hospital Stay: Payer: BC Managed Care – PPO | Attending: Internal Medicine | Admitting: Internal Medicine

## 2020-07-07 ENCOUNTER — Other Ambulatory Visit: Payer: Self-pay

## 2020-07-07 VITALS — BP 141/95 | HR 83 | Temp 99.6°F | Resp 20 | Ht 64.0 in | Wt 254.9 lb

## 2020-07-07 DIAGNOSIS — D72829 Elevated white blood cell count, unspecified: Secondary | ICD-10-CM | POA: Insufficient documentation

## 2020-07-07 DIAGNOSIS — R61 Generalized hyperhidrosis: Secondary | ICD-10-CM | POA: Insufficient documentation

## 2020-07-07 DIAGNOSIS — D649 Anemia, unspecified: Secondary | ICD-10-CM

## 2020-07-07 DIAGNOSIS — Z8249 Family history of ischemic heart disease and other diseases of the circulatory system: Secondary | ICD-10-CM | POA: Insufficient documentation

## 2020-07-07 DIAGNOSIS — R197 Diarrhea, unspecified: Secondary | ICD-10-CM | POA: Insufficient documentation

## 2020-07-07 DIAGNOSIS — Z87891 Personal history of nicotine dependence: Secondary | ICD-10-CM | POA: Diagnosis not present

## 2020-07-07 DIAGNOSIS — Z9049 Acquired absence of other specified parts of digestive tract: Secondary | ICD-10-CM | POA: Insufficient documentation

## 2020-07-07 DIAGNOSIS — E611 Iron deficiency: Secondary | ICD-10-CM

## 2020-07-07 DIAGNOSIS — D75839 Thrombocytosis, unspecified: Secondary | ICD-10-CM | POA: Diagnosis not present

## 2020-07-07 DIAGNOSIS — Z793 Long term (current) use of hormonal contraceptives: Secondary | ICD-10-CM | POA: Insufficient documentation

## 2020-07-07 DIAGNOSIS — R5383 Other fatigue: Secondary | ICD-10-CM | POA: Insufficient documentation

## 2020-07-07 DIAGNOSIS — Z806 Family history of leukemia: Secondary | ICD-10-CM | POA: Insufficient documentation

## 2020-07-07 DIAGNOSIS — R079 Chest pain, unspecified: Secondary | ICD-10-CM | POA: Insufficient documentation

## 2020-07-07 DIAGNOSIS — R091 Pleurisy: Secondary | ICD-10-CM | POA: Insufficient documentation

## 2020-07-07 DIAGNOSIS — Z833 Family history of diabetes mellitus: Secondary | ICD-10-CM | POA: Insufficient documentation

## 2020-07-07 DIAGNOSIS — E039 Hypothyroidism, unspecified: Secondary | ICD-10-CM | POA: Diagnosis not present

## 2020-07-07 DIAGNOSIS — Z8659 Personal history of other mental and behavioral disorders: Secondary | ICD-10-CM

## 2020-07-07 LAB — CMP (CANCER CENTER ONLY)
ALT: 12 U/L (ref 0–44)
AST: 11 U/L — ABNORMAL LOW (ref 15–41)
Albumin: 3.5 g/dL (ref 3.5–5.0)
Alkaline Phosphatase: 55 U/L (ref 38–126)
Anion gap: 6 (ref 5–15)
BUN: 13 mg/dL (ref 6–20)
CO2: 27 mmol/L (ref 22–32)
Calcium: 9.4 mg/dL (ref 8.9–10.3)
Chloride: 103 mmol/L (ref 98–111)
Creatinine: 0.81 mg/dL (ref 0.44–1.00)
GFR, Estimated: >60 mL/min (ref >60)
Glucose, Bld: 119 mg/dL — ABNORMAL HIGH (ref 70–99)
Potassium: 3.7 mmol/L (ref 3.5–5.1)
Sodium: 136 mmol/L (ref 135–145)
Total Bilirubin: <0.2 mg/dL — ABNORMAL LOW (ref 0.3–1.2)
Total Protein: 7.5 g/dL (ref 6.5–8.1)

## 2020-07-07 LAB — CBC WITH DIFFERENTIAL (CANCER CENTER ONLY)
Abs Immature Granulocytes: 0.03 10*3/uL (ref 0.00–0.07)
Basophils Absolute: 0.1 10*3/uL (ref 0.0–0.1)
Basophils Relative: 1 %
Eosinophils Absolute: 0.2 10*3/uL (ref 0.0–0.5)
Eosinophils Relative: 2 %
HCT: 42.2 % (ref 36.0–46.0)
Hemoglobin: 13.9 g/dL (ref 12.0–15.0)
Immature Granulocytes: 0 %
Lymphocytes Relative: 29 %
Lymphs Abs: 3.4 10*3/uL (ref 0.7–4.0)
MCH: 29.1 pg (ref 26.0–34.0)
MCHC: 32.9 g/dL (ref 30.0–36.0)
MCV: 88.3 fL (ref 80.0–100.0)
Monocytes Absolute: 0.5 10*3/uL (ref 0.1–1.0)
Monocytes Relative: 5 %
Neutro Abs: 7.4 10*3/uL (ref 1.7–7.7)
Neutrophils Relative %: 63 %
Platelet Count: 427 10*3/uL — ABNORMAL HIGH (ref 150–400)
RBC: 4.78 MIL/uL (ref 3.87–5.11)
RDW: 14.2 % (ref 11.5–15.5)
WBC Count: 11.6 10*3/uL — ABNORMAL HIGH (ref 4.0–10.5)
nRBC: 0 % (ref 0.0–0.2)

## 2020-07-07 LAB — LACTATE DEHYDROGENASE: LDH: 130 U/L (ref 98–192)

## 2020-07-07 LAB — IRON AND TIBC
Iron: 67 ug/dL (ref 41–142)
Saturation Ratios: 14 % — ABNORMAL LOW (ref 21–57)
TIBC: 470 ug/dL — ABNORMAL HIGH (ref 236–444)
UIBC: 404 ug/dL — ABNORMAL HIGH (ref 120–384)

## 2020-07-07 LAB — FERRITIN: Ferritin: 59 ng/mL (ref 11–307)

## 2020-07-07 NOTE — Progress Notes (Signed)
[facility] [phone]   [phone]  CONSULT NOTE  REFERRING PHYSICIAN: Mcneil Sober, FNP  REASON FOR CONSULTATION:  40 years old white female with thrombocytosis  HPI Chelsey Walker is a 40 y.o. female with past medical history significant for anxiety, GERD, hypothyroidism after treatment for hyperthyroidism with atrophied thyroid gland.  The patient also has PCOS, history of alcoholic seizure as well as status postcholecystectomy and right knee surgery.  She was seen by her primary care physician complaining of feeling tired tingling as well as swelling of the legs and headache.  She was noted in several previous blood work to have persistent mild leukocytosis as well as thrombocytosis.  The patient has a family history of CLL in her grandmother and SLL in her mother.  Her minister.  Usually last around 2 days with no clots and she is currently on birth controls pills she also has occasional pain in the stomach with sweating but no significant shortness of breath, cough or hemoptysis.  She has occasional chest pain.  She has intermittent diarrhea with no nausea, vomiting or constipation.  She denied having any significant weight loss. She takes multivitamins but no iron supplements. Family history significant for mother with history of seizure and SLL.  Father had diabetes mellitus and hypertension and sister had Graves' disease.  The patient is married and has no children.  She works as a Arboriculturist at [employer].  She was accompanied today by her mother Chelsey Walker.  She has a history of smoking for around 20 years but quit 5 years ago.  She also has a history of alcohol abuse in the past and now drinks socially.  She has no history of drug abuse.  HPI  Past Medical History:  Diagnosis Date  . Anxiety   . GERD (gastroesophageal reflux disease)   . Headache(784.0)   . Hyperthyroidism   . PCOS (polycystic ovarian syndrome)   . Seizures (HCC)    . Thyroid disease     Past Surgical History:  Procedure Laterality Date  . CHOLECYSTECTOMY N/A 11/12/2013   Procedure: LAPAROSCOPIC CHOLECYSTECTOMY WITH INTRAOPERATIVE CHOLANGIOGRAM;  Surgeon: Chevis Pretty III, MD;  Location: [facility] OR;  Service: General;  Laterality: N/A;  . KNEE SURGERY  2003    Family History  Problem Relation Age of Onset  . Seizures Mother   . Mental illness Mother   . Diabetes Father   . Hypertension Father   . Mental retardation Sister   . Diabetes Maternal Grandmother   . Diabetes Paternal Grandmother   . Hypertension Paternal Grandmother     Social History Social History   Tobacco Use  . Smoking status: Former Smoker    Packs/day: 0.50    Types: Cigarettes  . Smokeless tobacco: Never Used  . Tobacco comment: Quit smoking cigarettes in 2014  Substance Use Topics  . Alcohol use: No  . Drug use: No    No Known Allergies  Current Outpatient Medications  Medication Sig Dispense Refill  . buPROPion (WELLBUTRIN XL) 150 MG 24 hr tablet TK 1 T PO D  4  . Dextromethorphan-Guaifenesin (MUCINEX DM MAXIMUM STRENGTH) 60-1200 MG TB12 Take 1 tablet by mouth every 12 (twelve) hours. 14 each 1  . HYDROcodone-homatropine (HYCODAN) 5-1.5 MG/5ML syrup Take 5 mLs by mouth every 8 (eight) hours as needed for cough. 120 mL 0  . ipratropium (ATROVENT) 0.03 % nasal spray Place 2 sprays into the nose 4 (four) times daily. 30 mL 1  .  levothyroxine (SYNTHROID, LEVOTHROID) 137 MCG tablet Take 137 mcg by mouth daily before breakfast.    . Multiple Vitamins-Minerals (MULTIVITAMIN WITH MINERALS) tablet Take 1 tablet by mouth daily.    . norgestrel-ethinyl estradiol (LO/OVRAL,CRYSELLE) 0.3-30 MG-MCG tablet Take 1 tablet by mouth daily.    . phentermine (ADIPEX-P) 37.5 MG tablet 1/2 tab po qam  1   No current facility-administered medications for this visit.    Review of Systems  Constitutional: positive for fatigue and night sweats Eyes: negative Ears, nose, mouth, throat,  and face: negative Respiratory: positive for pleurisy/chest pain Cardiovascular: negative Gastrointestinal: positive for diarrhea Genitourinary:negative Integument/breast: negative Hematologic/lymphatic: negative Musculoskeletal:negative Neurological: negative Behavioral/Psych: negative Endocrine: negative Allergic/Immunologic: negative  Physical Exam  YPP:JKDTO, healthy, no distress, well nourished, well developed and anxious SKIN: skin color, texture, turgor are normal, no rashes or significant lesions HEAD: Normocephalic, No masses, lesions, tenderness or abnormalities EYES: normal, PERRLA, Conjunctiva are pink and non-injected EARS: External ears normal, Canals clear OROPHARYNX:no exudate, no erythema and lips, buccal mucosa, and tongue normal  NECK: supple, no adenopathy, no JVD LYMPH:  no palpable lymphadenopathy, no hepatosplenomegaly BREAST:not examined LUNGS: clear to auscultation , and palpation HEART: regular rate & rhythm, no murmurs and no gallops ABDOMEN:abdomen soft, non-tender, obese, normal bowel sounds and no masses or organomegaly BACK: No CVA tenderness, Range of motion is normal EXTREMITIES:no joint deformities, effusion, or inflammation, no edema  NEURO: alert & oriented x 3 with fluent speech, no focal motor/sensory deficits  PERFORMANCE STATUS: ECOG 1  LABORATORY DATA: Lab Results  Component Value Date   WBC 11.6 (H) 07/07/2020   HGB 13.9 07/07/2020   HCT 42.2 07/07/2020   MCV 88.3 07/07/2020   PLT 427 (H) 07/07/2020      Chemistry      Component Value Date/Time   NA 137 11/09/2013 1415   K 4.0 11/09/2013 1415   CL 102 11/09/2013 1415   CO2 22 11/09/2013 1415   BUN 13 11/09/2013 1415   CREATININE 0.78 11/09/2013 1415      Component Value Date/Time   CALCIUM 8.8 11/09/2013 1415   ALKPHOS 80 10/05/2013 0723   AST 61 (H) 10/05/2013 0723   ALT 44 (H) 10/05/2013 0723   BILITOT 0.5 10/05/2013 0723       RADIOGRAPHIC STUDIES: No results  found.  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   PLAN:  I had a lengthy discussion with the patient and her mother today about her current condition and further investigation to confirm her diagnosis. I order several studies today including repeat CBC that showed mildly elevated platelets count of 427,000 in addition to slightly elevated white blood count of 11.6. Her iron studies and ferritin are normal except for the low iron saturation.  She has normal LDH and comprehensive metabolic panel. I will arrange for the patient to have molecular study with JAK2 mutation panel to rule out any underlying myeloproliferative disorder because of her family history. I will see the patient back for follow-up visit in 2 months for evaluation and repeat CBC. I also recommend for the patient to start taking oral iron tablet 1-2 tablet every day few times a week. The patient was advised to call immediately if she has any other concerning symptoms in the interval. The patient voices understanding of current disease status and  treatment options and is in agreement with the current care plan.  All questions were answered. The patient knows to call the clinic with any problems, questions or concerns. We can certainly see the patient much sooner if necessary.  Thank you so much for allowing me to participate in the care of [facility]. I will continue to follow up the patient with you and assist in her care.  The total time spent in the appointment was 60 minutes.  Disclaimer: This note was dictated with voice recognition software. Similar sounding words can inadvertently be transcribed and may not be corrected upon review.   Lajuana Matte Jul 07, 2020, 12:10 PM

## 2020-07-08 LAB — FOLATE RBC
Folate, Hemolysate: 500 ng/mL
Folate, RBC: 1179 ng/mL (ref >498)
Hematocrit: 42.4 % (ref 34.0–46.6)

## 2020-07-17 LAB — JAK2 (INCLUDING V617F AND EXON 12), MPL,& CALR-NEXT GEN SEQ

## 2020-09-08 ENCOUNTER — Inpatient Hospital Stay: Payer: BC Managed Care – PPO | Attending: Internal Medicine | Admitting: Internal Medicine

## 2020-09-08 ENCOUNTER — Other Ambulatory Visit: Payer: Self-pay

## 2020-09-08 ENCOUNTER — Encounter: Payer: Self-pay | Admitting: Medical Oncology

## 2020-09-08 ENCOUNTER — Inpatient Hospital Stay (HOSPITAL_BASED_OUTPATIENT_CLINIC_OR_DEPARTMENT_OTHER): Payer: BC Managed Care – PPO

## 2020-09-08 VITALS — BP 122/77 | HR 90 | Temp 97.8°F | Resp 20 | Ht 64.0 in | Wt 257.9 lb

## 2020-09-08 DIAGNOSIS — D75839 Thrombocytosis, unspecified: Secondary | ICD-10-CM | POA: Diagnosis not present

## 2020-09-08 DIAGNOSIS — D75838 Other thrombocytosis: Secondary | ICD-10-CM | POA: Diagnosis present

## 2020-09-08 DIAGNOSIS — Z79899 Other long term (current) drug therapy: Secondary | ICD-10-CM | POA: Diagnosis not present

## 2020-09-08 DIAGNOSIS — D649 Anemia, unspecified: Secondary | ICD-10-CM

## 2020-09-08 DIAGNOSIS — D72829 Elevated white blood cell count, unspecified: Secondary | ICD-10-CM | POA: Insufficient documentation

## 2020-09-08 DIAGNOSIS — Z79890 Hormone replacement therapy (postmenopausal): Secondary | ICD-10-CM | POA: Insufficient documentation

## 2020-09-08 LAB — CBC WITH DIFFERENTIAL (CANCER CENTER ONLY)
Abs Immature Granulocytes: 0.06 10*3/uL (ref 0.00–0.07)
Basophils Absolute: 0.1 10*3/uL (ref 0.0–0.1)
Basophils Relative: 1 %
Eosinophils Absolute: 0.4 10*3/uL (ref 0.0–0.5)
Eosinophils Relative: 3 %
HCT: 41.2 % (ref 36.0–46.0)
Hemoglobin: 14.1 g/dL (ref 12.0–15.0)
Immature Granulocytes: 0 %
Lymphocytes Relative: 28 %
Lymphs Abs: 3.8 10*3/uL (ref 0.7–4.0)
MCH: 29.6 pg (ref 26.0–34.0)
MCHC: 34.2 g/dL (ref 30.0–36.0)
MCV: 86.4 fL (ref 80.0–100.0)
Monocytes Absolute: 0.8 10*3/uL (ref 0.1–1.0)
Monocytes Relative: 6 %
Neutro Abs: 8.5 10*3/uL — ABNORMAL HIGH (ref 1.7–7.7)
Neutrophils Relative %: 62 %
Platelet Count: 479 10*3/uL — ABNORMAL HIGH (ref 150–400)
RBC: 4.77 MIL/uL (ref 3.87–5.11)
RDW: 13.8 % (ref 11.5–15.5)
WBC Count: 13.7 10*3/uL — ABNORMAL HIGH (ref 4.0–10.5)
nRBC: 0 % (ref 0.0–0.2)

## 2020-09-08 NOTE — Progress Notes (Signed)
[facility] [phone]   [phone]) [phone]  OFFICE PROGRESS NOTE  Dorisann Frames, MD [address] [place] [address]  DIAGNOSIS: Reactive thrombocytosis and leukocytosis.  The patient has negative JAK2 mutation panel.  PRIOR THERAPY: None  CURRENT THERAPY: Observation  INTERVAL HISTORY: Chelsey Walker 40 y.o. female returns to the clinic today for follow-up visit accompanied by her mother.  The patient is feeling fine today with no concerning complaints.  She denied having any current chest pain, shortness of breath, cough or hemoptysis.  She denied having any fever or chills.  She has no bruises, bleeding or ecchymosis.  She had extensive blood work on the last visit that were unremarkable except for mild decrease in iron saturation.  She is taking oral iron tablet every night.  The patient had JAK2 mutation panel performed and she is here for evaluation and discussion of her lab results.  MEDICAL HISTORY: Past Medical History:  Diagnosis Date   Anxiety    GERD (gastroesophageal reflux disease)    Headache(784.0)    Hyperthyroidism    PCOS (polycystic ovarian syndrome)    Seizures (HCC)    Thyroid disease     ALLERGIES:  has No Known Allergies.  MEDICATIONS:  Current Outpatient Medications  Medication Sig Dispense Refill   SAXENDA 18 MG/3ML SOPN Inject 2.4 mg into the skin daily. For weight loss     EUTHYROX 150 MCG tablet Take 150 mcg by mouth daily.     hydrochlorothiazide (HYDRODIURIL) 25 MG tablet Take 0.5 tablets by mouth every morning.     Multiple Vitamins-Minerals (MULTIVITAMIN WITH MINERALS) tablet Take 1 tablet by mouth daily.     norgestrel-ethinyl estradiol (LO/OVRAL,CRYSELLE) 0.3-30 MG-MCG tablet Take 1 tablet by mouth daily.     SRONYX 0.1-20 MG-MCG tablet Take 1 tablet by mouth daily.     No current facility-administered medications for this visit.    SURGICAL HISTORY:  Past Surgical History:   Procedure Laterality Date   CHOLECYSTECTOMY N/A 11/12/2013   Procedure: LAPAROSCOPIC CHOLECYSTECTOMY WITH INTRAOPERATIVE CHOLANGIOGRAM;  Surgeon: Chevis Pretty III, MD;  Location: [facility] OR;  Service: General;  Laterality: N/A;   KNEE SURGERY  2003    REVIEW OF SYSTEMS:  A comprehensive review of systems was negative.   PHYSICAL EXAMINATION: General appearance: alert, cooperative, and no distress Head: Normocephalic, without obvious abnormality, atraumatic Neck: no adenopathy, no JVD, supple, symmetrical, trachea midline, and thyroid not enlarged, symmetric, no tenderness/mass/nodules Lymph nodes: Cervical, supraclavicular, and axillary nodes normal. Resp: clear to auscultation bilaterally Back: symmetric, no curvature. ROM normal. No CVA tenderness. Cardio: regular rate and rhythm, S1, S2 normal, no murmur, click, rub or gallop GI: soft, non-tender; bowel sounds normal; no masses,  no organomegaly Extremities: extremities normal, atraumatic, no cyanosis or edema  ECOG PERFORMANCE STATUS: 1 - Symptomatic but completely ambulatory  Blood pressure 122/77, pulse 90, temperature 97.8 F (36.6 C), temperature source Tympanic, resp. rate 20, height 5\' 4"  (1.626 m), weight 257 lb 14.4 oz (117 kg), SpO2 99 %.  LABORATORY DATA: Lab Results  Component Value Date   WBC 13.7 (H) 09/08/2020   HGB 14.1 09/08/2020   HCT 41.2 09/08/2020   MCV 86.4 09/08/2020   PLT 479 (H) 09/08/2020      Chemistry      Component Value Date/Time   NA 136 07/07/2020 1133   K 3.7 07/07/2020 1133   CL 103 07/07/2020 1133   CO2 27 07/07/2020 1133  BUN 13 07/07/2020 1133   CREATININE 0.81 07/07/2020 1133      Component Value Date/Time   CALCIUM 9.4 07/07/2020 1133   ALKPHOS 55 07/07/2020 1133   AST 11 (L) 07/07/2020 1133   ALT 12 07/07/2020 1133   BILITOT <0.2 (L) 07/07/2020 1133       RADIOGRAPHIC STUDIES: No results found.  ASSESSMENT AND PLAN: This is a very pleasant 40 years old white female with  persistent leukocytosis and thrombocytosis most likely reactive in nature secondary to hormonal treatment.  The patient underwent extensive blood work including molecular studies for JAK2 mutation panel that were reported to be negative. I discussed the lab results with the patient and recommended her to continue on observation with routine follow-up visit by her primary care physician from now on. I do not see need for any additional studies at this point but I will be happy to see the patient in the future if she has any significant concerning abnormalities. She was advised to call if she has any concerning symptoms. The patient voices understanding of current disease status and treatment options and is in agreement with the current care plan.  All questions were answered. The patient knows to call the clinic with any problems, questions or concerns. We can certainly see the patient much sooner if necessary.  The total time spent in the appointment was 20 minutes.  Disclaimer: This note was dictated with voice recognition software. Similar sounding words can inadvertently be transcribed and may not be corrected upon review.

## 2021-03-26 ENCOUNTER — Ambulatory Visit (INDEPENDENT_AMBULATORY_CARE_PROVIDER_SITE_OTHER): Payer: BC Managed Care – PPO

## 2021-03-26 ENCOUNTER — Other Ambulatory Visit: Payer: Self-pay

## 2021-03-26 ENCOUNTER — Encounter: Payer: Self-pay | Admitting: Podiatry

## 2021-03-26 ENCOUNTER — Ambulatory Visit (INDEPENDENT_AMBULATORY_CARE_PROVIDER_SITE_OTHER): Payer: BC Managed Care – PPO | Admitting: Podiatry

## 2021-03-26 DIAGNOSIS — M722 Plantar fascial fibromatosis: Secondary | ICD-10-CM

## 2021-03-26 DIAGNOSIS — M792 Neuralgia and neuritis, unspecified: Secondary | ICD-10-CM | POA: Diagnosis not present

## 2021-03-26 MED ORDER — METHYLPREDNISOLONE 4 MG PO TBPK: ORAL_TABLET | ORAL | 0 refills | Status: AC

## 2021-03-26 NOTE — Patient Instructions (Signed)
For instructions on how to put on your Plantar Fascial Brace, please visit [email or web address]   Plantar Fasciitis (Heel Spur Syndrome) with Rehab The plantar fascia is a fibrous, ligament-like, soft-tissue structure that spans the bottom of the foot. Plantar fasciitis is a condition that causes pain in the foot due to inflammation of the tissue. SYMPTOMS   Pain and tenderness on the underneath side of the foot.  Pain that worsens with standing or walking. CAUSES  Plantar fasciitis is caused by irritation and injury to the plantar fascia on the underneath side of the foot. Common mechanisms of injury include:  Direct trauma to bottom of the foot.  Damage to a small nerve that runs under the foot where the main fascia attaches to the heel bone.  Stress placed on the plantar fascia due to bone spurs. RISK INCREASES WITH:   Activities that place stress on the plantar fascia (running, jumping, pivoting, or cutting).  Poor strength and flexibility.  Improperly fitted shoes.  Tight calf muscles.  Flat feet.  Failure to warm-up properly before activity.  Obesity. PREVENTION  Warm up and stretch properly before activity.  Allow for adequate recovery between workouts.  Maintain physical fitness:  Strength, flexibility, and endurance.  Cardiovascular fitness.  Maintain a health body weight.  Avoid stress on the plantar fascia.  Wear properly fitted shoes, including arch supports for individuals who have flat feet.  PROGNOSIS  If treated properly, then the symptoms of plantar fasciitis usually resolve without surgery. However, occasionally surgery is necessary.  RELATED COMPLICATIONS   Recurrent symptoms that may result in a chronic condition.  Problems of the lower back that are caused by compensating for the injury, such as limping.  Pain or weakness of the foot during push-off following surgery.  Chronic inflammation, scarring, and partial or complete  fascia tear, occurring more often from repeated injections.  TREATMENT  Treatment initially involves the use of ice and medication to help reduce pain and inflammation. The use of strengthening and stretching exercises may help reduce pain with activity, especially stretches of the Achilles tendon. These exercises may be performed at home or with a therapist. Your caregiver may recommend that you use heel cups of arch supports to help reduce stress on the plantar fascia. Occasionally, corticosteroid injections are given to reduce inflammation. If symptoms persist for greater than 6 months despite non-surgical (conservative), then surgery may be recommended.   MEDICATION   If pain medication is necessary, then nonsteroidal anti-inflammatory medications, such as aspirin and ibuprofen, or other minor pain relievers, such as acetaminophen, are often recommended.  Do not take pain medication within 7 days before surgery.  Prescription pain relievers may be given if deemed necessary by your caregiver. Use only as directed and only as much as you need.  Corticosteroid injections may be given by your caregiver. These injections should be reserved for the most serious cases, because they may only be given a certain number of times.  HEAT AND COLD  Cold treatment (icing) relieves pain and reduces inflammation. Cold treatment should be applied for 10 to 15 minutes every 2 to 3 hours for inflammation and pain and immediately after any activity that aggravates your symptoms. Use ice packs or massage the area with a piece of ice (ice massage).  Heat treatment may be used prior to performing the stretching and strengthening activities prescribed by your caregiver, physical therapist, or athletic trainer. Use a heat pack or soak the injury in warm water.  SEEK IMMEDIATE MEDICAL   CARE IF:  Treatment seems to offer no benefit, or the condition worsens.  Any medications produce adverse side effects.   EXERCISES- RANGE OF MOTION (ROM) AND STRETCHING EXERCISES - Plantar Fasciitis (Heel Spur Syndrome) These exercises may help you when beginning to rehabilitate your injury. Your symptoms may resolve with or without further involvement from your physician, physical therapist or athletic trainer. While completing these exercises, remember:   Restoring tissue flexibility helps normal motion to return to the joints. This allows healthier, less painful movement and activity.  An effective stretch should be held for at least 30 seconds.  A stretch should never be painful. You should only feel a gentle lengthening or release in the stretched tissue.  RANGE OF MOTION - Toe Extension, Flexion  Sit with your right / left leg crossed over your opposite knee.  Grasp your toes and gently pull them back toward the top of your foot. You should feel a stretch on the bottom of your toes and/or foot.  Hold this stretch for 10 seconds.  Now, gently pull your toes toward the bottom of your foot. You should feel a stretch on the top of your toes and or foot.  Hold this stretch for 10 seconds. Repeat  times. Complete this stretch 3 times per day.   RANGE OF MOTION - Ankle Dorsiflexion, Active Assisted  Remove shoes and sit on a chair that is preferably not on a carpeted surface.  Place right / left foot under knee. Extend your opposite leg for support.  Keeping your heel down, slide your right / left foot back toward the chair until you feel a stretch at your ankle or calf. If you do not feel a stretch, slide your bottom forward to the edge of the chair, while still keeping your heel down.  Hold this stretch for 10 seconds. Repeat 3 times. Complete this stretch 2 times per day.   STRETCH  Gastroc, Standing  Place hands on wall.  Extend right / left leg, keeping the front knee somewhat bent.  Slightly point your toes inward on your back foot.  Keeping your right / left heel on the floor and your  knee straight, shift your weight toward the wall, not allowing your back to arch.  You should feel a gentle stretch in the right / left calf. Hold this position for 10 seconds. Repeat 3 times. Complete this stretch 2 times per day.  STRETCH  Soleus, Standing  Place hands on wall.  Extend right / left leg, keeping the other knee somewhat bent.  Slightly point your toes inward on your back foot.  Keep your right / left heel on the floor, bend your back knee, and slightly shift your weight over the back leg so that you feel a gentle stretch deep in your back calf.  Hold this position for 10 seconds. Repeat 3 times. Complete this stretch 2 times per day.  STRETCH  Gastrocsoleus, Standing  Note: This exercise can place a lot of stress on your foot and ankle. Please complete this exercise only if specifically instructed by your caregiver.   Place the ball of your right / left foot on a step, keeping your other foot firmly on the same step.  Hold on to the wall or a rail for balance.  Slowly lift your other foot, allowing your body weight to press your heel down over the edge of the step.  You should feel a stretch in your right / left calf.  Hold this   position for 10 seconds.  Repeat this exercise with a slight bend in your right / left knee. Repeat 3 times. Complete this stretch 2 times per day.   STRENGTHENING EXERCISES - Plantar Fasciitis (Heel Spur Syndrome)  These exercises may help you when beginning to rehabilitate your injury. They may resolve your symptoms with or without further involvement from your physician, physical therapist or athletic trainer. While completing these exercises, remember:   Muscles can gain both the endurance and the strength needed for everyday activities through controlled exercises.  Complete these exercises as instructed by your physician, physical therapist or athletic trainer. Progress the resistance and repetitions only as guided.  STRENGTH -  Towel Curls  Sit in a chair positioned on a non-carpeted surface.  Place your foot on a towel, keeping your heel on the floor.  Pull the towel toward your heel by only curling your toes. Keep your heel on the floor. Repeat 3 times. Complete this exercise 2 times per day.  STRENGTH - Ankle Inversion  Secure one end of a rubber exercise band/tubing to a fixed object (table, pole). Loop the other end around your foot just before your toes.  Place your fists between your knees. This will focus your strengthening at your ankle.  Slowly, pull your big toe up and in, making sure the band/tubing is positioned to resist the entire motion.  Hold this position for 10 seconds.  Have your muscles resist the band/tubing as it slowly pulls your foot back to the starting position. Repeat 3 times. Complete this exercises 2 times per day.  Document Released: 02/01/2005 Document Revised: 04/26/2011 Document Reviewed: 05/16/2008 [employer] Patient Information 2014 [employer]. 

## 2021-03-31 ENCOUNTER — Encounter: Payer: Self-pay | Admitting: Neurology

## 2021-03-31 NOTE — Progress Notes (Signed)
Subjective:   Patient ID: Chelsey Walker, female   DOB: 41 y.o.   MRN: [id number]   HPI 41 year old female presents the office today with concerns of bilateral foot pain mostly her heel that started about 1 year ago.  She states that she feels a needle sensation in the arch of the heel as well as to touch.  She does get the nerve sensations going up the leg as well.  She states that at times she does get pain in the low back.  She does notice a swelling in both sides.  No recent injury that she reports.  She states the left side is about equal to the right at this time.  She has some discomfort in the morning but she gets a lot of discomfort more at nighttime.  She states that she is taking her foot off the bed at times due to the discomfort and does wake her up.  She has tried stretching, icing as well as calf raises and changing insoles without any significant long-term relief.   Review of Systems  All other systems reviewed and are negative.  Past Medical History:  Diagnosis Date   Anxiety    GERD (gastroesophageal reflux disease)    Headache(784.0)    Hyperthyroidism    PCOS (polycystic ovarian syndrome)    Seizures (HCC)    Thyroid disease     Past Surgical History:  Procedure Laterality Date   CHOLECYSTECTOMY N/A 11/12/2013   Procedure: LAPAROSCOPIC CHOLECYSTECTOMY WITH INTRAOPERATIVE CHOLANGIOGRAM;  Surgeon: Chevis Pretty III, MD;  Location: [facility] OR;  Service: General;  Laterality: N/A;   KNEE SURGERY  2003     Current Outpatient Medications:    methylPREDNISolone (MEDROL DOSEPAK) 4 MG TBPK tablet, Take as directed, Disp: 21 tablet, Rfl: 0   EUTHYROX 150 MCG tablet, Take 150 mcg by mouth daily., Disp: , Rfl:    hydrochlorothiazide (HYDRODIURIL) 25 MG tablet, Take 0.5 tablets by mouth every morning., Disp: , Rfl:    Multiple Vitamins-Minerals (MULTIVITAMIN WITH MINERALS) tablet, Take 1 tablet by mouth daily., Disp: , Rfl:    norgestrel-ethinyl estradiol (LO/OVRAL,CRYSELLE) 0.3-30  MG-MCG tablet, Take 1 tablet by mouth daily., Disp: , Rfl:    SAXENDA 18 MG/3ML SOPN, Inject 2.4 mg into the skin daily. For weight loss, Disp: , Rfl:    SRONYX 0.1-20 MG-MCG tablet, Take 1 tablet by mouth daily., Disp: , Rfl:   No Known Allergies        Objective:  Physical Exam  General: AAO x3, NAD  Dermatological: Skin is warm, dry and supple bilateral. There are no open sores, no preulcerative lesions, no rash or signs of infection present.  Vascular: Dorsalis Pedis artery and Posterior Tibial artery pedal pulses are 2/4 bilateral with immedate capillary fill time.  There is no pain with calf compression, swelling, warmth, erythema.   Neruologic: Sensation intact with Chelsey Walker monofilament.  There is a positive Tinel's sign the left side  Musculoskeletal: Tenderness palpation on plantar medial medial tubercle of the calcaneus at the insertion of plantar fascia bilaterally.  Is also discomfort in the arch of the foot.  No specific area of pinpoint tenderness.  Flexor, sensory tendons appear to be intact.  No pain Achilles tendon.  Muscular strength 5/5 in all groups tested bilateral.  Gait: Unassisted, Nonantalgic.       Assessment:   Bilateral foot pain, likely plantar fasciitis; concern for possible nerve issues as well     Plan:  -Treatment options discussed including  all alternatives, risks, and complications -Etiology of symptoms were discussed-while she does have symptoms of plantar fascia she is also being feet which radiate up her leg and she gets low back pain. -X-rays were obtained and reviewed with the patient.  No evidence of acute fracture noted today. -From a foot pain, Planter fasciitis standpoint dispensed plantar fascial braces x2.  Prescribed Medrol Dosepak.  Discussed stretching, icing daily.  Discussed shoe modifications and arch support as well.  Consider physical therapy referral or advanced imaging. -Referral to neurology  Chelsey Walker  DPM

## 2021-04-09 ENCOUNTER — Telehealth: Payer: Self-pay | Admitting: *Deleted

## 2021-04-09 NOTE — Telephone Encounter (Signed)
Patient is calling to request a methylprednisolone refill, pills work wonderful. Please advise.

## 2021-04-10 ENCOUNTER — Other Ambulatory Visit: Payer: Self-pay | Admitting: Podiatry

## 2021-04-10 MED ORDER — MELOXICAM 15 MG PO TABS: 15.0000 mg | ORAL_TABLET | Freq: Every day | ORAL | 0 refills | Status: AC | PRN

## 2021-04-23 ENCOUNTER — Ambulatory Visit (INDEPENDENT_AMBULATORY_CARE_PROVIDER_SITE_OTHER): Payer: BC Managed Care – PPO | Admitting: Podiatry

## 2021-04-23 ENCOUNTER — Encounter: Payer: Self-pay | Admitting: Podiatry

## 2021-04-23 ENCOUNTER — Other Ambulatory Visit: Payer: Self-pay

## 2021-04-23 DIAGNOSIS — M722 Plantar fascial fibromatosis: Secondary | ICD-10-CM | POA: Diagnosis not present

## 2021-04-26 NOTE — Progress Notes (Signed)
Subjective: ?41 year old female presents the office today for follow-up evaluation of heel pain.  She did finish the course of steroids which did help and she is using the brace for support.  She is not having any pain today.  She is scheduled to see neurology next month.  No recent injuries or changes since last appointment. ? ?Objective: ?AAO x3, NAD ?DP/PT pulses palpable bilaterally, CRT less than 3 seconds ?On today's exam there is no specific area of tenderness.  There is no tenderness palpation on plantar medial tubercle of the calcaneus at the insertion of plantar fascia that she was having last appointment.  There is no pain with lateral compression of calcaneus.  No edema, erythema.  Today Tinel sign is negative.  MMT 5/5. ?No pain with calf compression, swelling, warmth, erythema ? ?Assessment: ?Bilateral foot pain, Plantar fasciitis; neuritis ? ?Plan: ?-All treatment options discussed with the patient including all alternatives, risks, complications.  ?-Overall the foot pain symptoms have much improved and she is not having any pain today.  Continue stretching, icing daily.  Discussed shoe modifications and good arch support. ?-She had a positive Tinel's sign as well as some nerve symptoms to her foot.  Due to this she was referred to neurology.  She has a follow-up next month. ?-Patient encouraged to call the office with any questions, concerns, change in symptoms.  ? ?[name] DPM ? ?

## 2021-05-18 ENCOUNTER — Encounter: Payer: Self-pay | Admitting: Neurology

## 2021-05-18 ENCOUNTER — Ambulatory Visit (INDEPENDENT_AMBULATORY_CARE_PROVIDER_SITE_OTHER): Payer: BC Managed Care – PPO | Admitting: Neurology

## 2021-05-18 VITALS — BP 132/89 | HR 93 | Ht 64.0 in | Wt 271.0 lb

## 2021-05-18 DIAGNOSIS — R202 Paresthesia of skin: Secondary | ICD-10-CM | POA: Diagnosis not present

## 2021-05-18 NOTE — Progress Notes (Signed)
?[facility] ?Neurology Division ?Clinic Note - Initial Visit ? ? ?Date: 05/18/21 ? ?Chelsey Walker ?MRN: [id number] ?DOB: 1980/11/17 ? ? ?Dear Dr. Ardelle Anton: ? ?Thank you for your kind referral of Chelsey Walker for consultation of numbness/tingling of the hands and feet. Although her history is well known to you, please allow [place] to reiterate it for the purpose of our medical record. The patient was accompanied to the clinic by self. ?  ? ?History of Present Illness: ?Chelsey Walker is a 41 y.o. right-handed female with plantar fasciitis, hypothyroidism, PCOS  presenting for evaluation of tingling of the hands and feet.  ? ?For the past year, she complains of numbness/tingling involving the top and soles of the feet which occurs about twice per week, which lasts anywhere from a few minutes to 30-mins.  It is triggered by prolonged sitting.  She also complains of tingling in the hands over the past 5 months, involving the palms.  It occurs sporadically throughout the day.   ? ?She works as a Arboriculturist for [employer].  She lives alone.   ? ?She has history of seizure in her 72s.  She does not recall the name of her seizure medication and has been seizure-free for at least two years.  ? ? ?Out-side paper records, electronic medical record, and images have been reviewed where available and summarized as:  ?Lab Results  ?Component Value Date  ? TSH 0.011 (L) 05/21/2012  ? ?No results found for: ESRSEDRATE, POCTSEDRATE ? ?Past Medical History:  ?Diagnosis Date  ? Anxiety   ? GERD (gastroesophageal reflux disease)   ? Headache(784.0)   ? Hyperthyroidism   ? PCOS (polycystic ovarian syndrome)   ? Seizures (HCC)   ? Thyroid disease   ? ? ?Past Surgical History:  ?Procedure Laterality Date  ? CHOLECYSTECTOMY N/A 11/12/2013  ? Procedure: LAPAROSCOPIC CHOLECYSTECTOMY WITH INTRAOPERATIVE CHOLANGIOGRAM;  Surgeon: Chevis Pretty III, MD;  Location: [facility] OR;  Service: General;  Laterality: N/A;  ? KNEE SURGERY  2003   ? ? ? ?Medications:  ?Outpatient Encounter Medications as of 05/18/2021  ?Medication Sig  ? cetirizine (ZYRTEC) 5 MG tablet Take 5 mg by mouth daily.  ? EUTHYROX 150 MCG tablet Take 150 mcg by mouth daily.  ? hydrochlorothiazide (HYDRODIURIL) 25 MG tablet Take 0.5 tablets by mouth every morning.  ? Multiple Vitamins-Minerals (MULTIVITAMIN WITH MINERALS) tablet Take 1 tablet by mouth daily.  ? norgestrel-ethinyl estradiol (LO/OVRAL,CRYSELLE) 0.3-30 MG-MCG tablet Take 1 tablet by mouth daily.  ? SAXENDA 18 MG/3ML SOPN Inject 2.4 mg into the skin daily. For weight loss  ? meloxicam (MOBIC) 15 MG tablet Take 1 tablet (15 mg total) by mouth daily as needed for pain. (Patient not taking: Reported on 05/18/2021)  ? methylPREDNISolone (MEDROL DOSEPAK) 4 MG TBPK tablet Take as directed (Patient not taking: Reported on 05/18/2021)  ? SRONYX 0.1-20 MG-MCG tablet Take 1 tablet by mouth daily. (Patient not taking: Reported on 05/18/2021)  ? ?No facility-administered encounter medications on file as of 05/18/2021.  ? ? ?Allergies: No Known Allergies ? ?Family History: ?Family History  ?Problem Relation Age of Onset  ? Seizures Mother   ? Mental illness Mother   ? Diabetes Father   ? Hypertension Father   ? Mental retardation Sister   ? Diabetes Maternal Grandmother   ? Diabetes Paternal Grandmother   ? Hypertension Paternal Grandmother   ? ? ?Social History: ?Social History  ? ?Tobacco Use  ? Smoking status: Former  ?  Packs/day: 0.50  ?  Types: Cigarettes  ? Smokeless tobacco: Never  ? Tobacco comments:  ?  Quit smoking cigarettes in 2014  ?Substance Use Topics  ? Alcohol use: No  ? Drug use: No  ? ?Social History  ? ?Social History Narrative  ? Right Handed   ? Lives in a one story home   ? Has a cat, tortoise, and two turtles.  ? ? ?Vital Signs:  ?BP 132/89   Pulse 93   Ht 5\' 4"  (1.626 m)   Wt 271 lb (122.9 kg)   SpO2 99%   BMI 46.52 kg/m?  ? ? ?Neurological Exam: ?MENTAL STATUS including orientation to time, place, person, recent  and remote memory, attention span and concentration, language, and fund of knowledge is normal.  Speech is not dysarthric. ? ?CRANIAL NERVES: ?II:  No visual field defects.   ?III-IV-VI: Pupils equal round and reactive to light.  Normal conjugate, extra-ocular eye movements in all directions of gaze.  No nystagmus.  No ptosis.   ?V:  Normal facial sensation.    ?VII:  Normal facial symmetry and movements.   ?VIII:  Normal hearing and vestibular function.   ?IX-X:  Normal palatal movement.   ?XI:  Normal shoulder shrug and head rotation.   ?XII:  Normal tongue strength and range of motion, no deviation or fasciculation. ? ?MOTOR:  Motor strength is 5/5 throughout.  No atrophy, fasciculations or abnormal movements.  No pronator drift.  ? ?MSRs:  ?Right        Left                  ?brachioradialis 2+  2+  ?biceps 2+  2+  ?triceps 2+  2+  ?patellar 2+  2+  ?ankle jerk 2+  2+  ?Hoffman no  no  ?plantar response down  down  ? ?SENSORY:  Normal and symmetric perception of light touch, pinprick, vibration, and proprioception.  Romberg's sign absent.  ? ?COORDINATION/GAIT: Normal finger-to- nose-finger.  Intact rapid alternating movements bilaterally. Gait narrow based and stable. Tandem and stressed gait intact.  ? ? ?IMPRESSION: ?Paresthesias involving the hands and feet, intermittent and self-limiting. Most of her symptoms tend to occur at rest and possible she may have mild compression of the nerve such as when sitting, especially as repositioning quickly resolves it.  I offered NCS/EMG of the right arm and leg to characterize her symptoms better.  She opted to hold on testing unless symptoms get worse. Patient reassured her exam is normal and there is no signs of neuropathy. All questions answered. ? ? ?Thank you for allowing me to participate in patient's care.  If I can answer any additional questions, I would be pleased to do so.   ? ?Sincerely, ? ? ? ?Keleigh Kazee K. , DO ? ?

## 2021-05-18 NOTE — Patient Instructions (Signed)
Nerve testing of right arm and leg can be ordered, if your symptoms get worse.  ? ? ?

## 2021-06-25 ENCOUNTER — Ambulatory Visit: Payer: Self-pay | Admitting: Podiatry

## 2021-09-02 DIAGNOSIS — E039 Hypothyroidism, unspecified: Secondary | ICD-10-CM | POA: Diagnosis not present

## 2021-09-02 DIAGNOSIS — I10 Essential (primary) hypertension: Secondary | ICD-10-CM | POA: Diagnosis not present

## 2021-09-09 DIAGNOSIS — Z0000 Encounter for general adult medical examination without abnormal findings: Secondary | ICD-10-CM | POA: Diagnosis not present

## 2021-09-09 DIAGNOSIS — Z1331 Encounter for screening for depression: Secondary | ICD-10-CM | POA: Diagnosis not present

## 2021-09-09 DIAGNOSIS — E039 Hypothyroidism, unspecified: Secondary | ICD-10-CM | POA: Diagnosis not present

## 2021-09-09 DIAGNOSIS — R82998 Other abnormal findings in urine: Secondary | ICD-10-CM | POA: Diagnosis not present

## 2021-09-09 DIAGNOSIS — Z1339 Encounter for screening examination for other mental health and behavioral disorders: Secondary | ICD-10-CM | POA: Diagnosis not present

## 2021-09-09 DIAGNOSIS — I10 Essential (primary) hypertension: Secondary | ICD-10-CM | POA: Diagnosis not present

## 2023-09-26 ENCOUNTER — Other Ambulatory Visit: Payer: Self-pay | Admitting: Internal Medicine

## 2023-09-26 DIAGNOSIS — Z1231 Encounter for screening mammogram for malignant neoplasm of breast: Secondary | ICD-10-CM

## 2023-10-11 ENCOUNTER — Ambulatory Visit
Admission: RE | Admit: 2023-10-11 | Discharge: 2023-10-11 | Disposition: A | Discharge status: Home / Self Care | Payer: Self-pay | Source: Ambulatory Visit | Attending: Internal Medicine | Admitting: Internal Medicine

## 2023-10-11 DIAGNOSIS — Z1231 Encounter for screening mammogram for malignant neoplasm of breast: Secondary | ICD-10-CM

## 2023-12-29 ENCOUNTER — Other Ambulatory Visit: Payer: Self-pay

## 2023-12-29 ENCOUNTER — Emergency Department (HOSPITAL_BASED_OUTPATIENT_CLINIC_OR_DEPARTMENT_OTHER)

## 2023-12-29 ENCOUNTER — Emergency Department (HOSPITAL_BASED_OUTPATIENT_CLINIC_OR_DEPARTMENT_OTHER): Admission: EM | Admit: 2023-12-29 | Discharge: 2023-12-30 | Disposition: A | Discharge status: Home / Self Care

## 2023-12-29 DIAGNOSIS — J029 Acute pharyngitis, unspecified: Secondary | ICD-10-CM | POA: Diagnosis present

## 2023-12-29 LAB — COMPREHENSIVE METABOLIC PANEL WITH GFR
ALT: 12 U/L (ref 0–44)
AST: 15 U/L (ref 15–41)
Albumin: 4.3 g/dL (ref 3.5–5.0)
Alkaline Phosphatase: 88 U/L (ref 38–126)
Anion gap: 15 (ref 5–15)
BUN: 9 mg/dL (ref 6–20)
CO2: 21 mmol/L — ABNORMAL LOW (ref 22–32)
Calcium: 10.1 mg/dL (ref 8.9–10.3)
Chloride: 94 mmol/L — ABNORMAL LOW (ref 98–111)
Creatinine, Ser: 0.83 mg/dL (ref 0.44–1.00)
GFR, Estimated: >60 mL/min (ref >60)
Glucose, Bld: 142 mg/dL — ABNORMAL HIGH (ref 70–99)
Potassium: 3.5 mmol/L (ref 3.5–5.1)
Sodium: 130 mmol/L — ABNORMAL LOW (ref 135–145)
Total Bilirubin: 0.5 mg/dL (ref 0.0–1.2)
Total Protein: 8.8 g/dL — ABNORMAL HIGH (ref 6.5–8.1)

## 2023-12-29 LAB — CBC WITH DIFFERENTIAL/PLATELET
Abs Immature Granulocytes: 0.37 K/uL — ABNORMAL HIGH (ref 0.00–0.07)
Basophils Absolute: 0.1 K/uL (ref 0.0–0.1)
Basophils Relative: 0 %
Eosinophils Absolute: 0 K/uL (ref 0.0–0.5)
Eosinophils Relative: 0 %
HCT: 41.3 % (ref 36.0–46.0)
Hemoglobin: 14.5 g/dL (ref 12.0–15.0)
Immature Granulocytes: 1 %
Lymphocytes Relative: 4 %
Lymphs Abs: 1.4 K/uL (ref 0.7–4.0)
MCH: 29.4 pg (ref 26.0–34.0)
MCHC: 35.1 g/dL (ref 30.0–36.0)
MCV: 83.6 fL (ref 80.0–100.0)
Monocytes Absolute: 0.3 K/uL (ref 0.1–1.0)
Monocytes Relative: 1 %
Neutro Abs: 29.3 K/uL — ABNORMAL HIGH (ref 1.7–7.7)
Neutrophils Relative %: 94 %
Platelets: 405 K/uL — ABNORMAL HIGH (ref 150–400)
RBC: 4.94 MIL/uL (ref 3.87–5.11)
RDW: 13.7 % (ref 11.5–15.5)
WBC: 31.5 K/uL — ABNORMAL HIGH (ref 4.0–10.5)
nRBC: 0 % (ref 0.0–0.2)

## 2023-12-29 LAB — LACTIC ACID, PLASMA: Lactic Acid, Venous: 0.8 mmol/L (ref 0.5–1.9)

## 2023-12-29 LAB — HCG, SERUM, QUALITATIVE: Preg, Serum: NEGATIVE

## 2023-12-29 LAB — GROUP A STREP BY PCR: Group A Strep by PCR: NOT DETECTED

## 2023-12-29 MED ORDER — ACETAMINOPHEN 325 MG PO TABS
650.0000 mg | ORAL_TABLET | Freq: Once | ORAL | Status: AC
  Administered 2023-12-29: 650 mg via ORAL
  Filled 2023-12-29: qty 2

## 2023-12-29 MED ORDER — SODIUM CHLORIDE 0.9 % IV SOLN
3.0000 g | Freq: Once | INTRAVENOUS | Status: AC
  Administered 2023-12-29: 3 g via INTRAVENOUS

## 2023-12-29 MED ORDER — SODIUM CHLORIDE 0.9 % IV SOLN: 3.0000 g | Freq: Once | INTRAVENOUS | Status: DC

## 2023-12-29 MED ORDER — AMOXICILLIN-POT CLAVULANATE 875-125 MG PO TABS: 1.0000 | ORAL_TABLET | Freq: Two times a day (BID) | ORAL | 0 refills | Status: AC

## 2023-12-29 MED ORDER — IOHEXOL 300 MG/ML  SOLN
75.0000 mL | Freq: Once | INTRAMUSCULAR | Status: AC | PRN
  Administered 2023-12-29: 75 mL via INTRAVENOUS

## 2023-12-29 MED ORDER — LACTATED RINGERS IV BOLUS
1000.0000 mL | Freq: Once | INTRAVENOUS | Status: AC
  Administered 2023-12-29: 1000 mL via INTRAVENOUS

## 2023-12-29 NOTE — Discharge Instructions (Addendum)
 Please follow-up with your primary doctor..  Return if develop fevers, chills, worsening pain, difficulty swallowing, difficulty breathing, or any new or worsening symptoms that are concerning to you

## 2023-12-29 NOTE — ED Triage Notes (Signed)
 Patient states sore throat since last night. Saw PCP who swabbed for strep and covid and was negative. Sent because WBC was high and they wanted to rule out peritonsillar abscess.

## 2023-12-29 NOTE — ED Notes (Signed)
 ED Provider at bedside.

## 2023-12-29 NOTE — ED Provider Notes (Addendum)
 [place] EMERGENCY DEPARTMENT AT [facility] Provider Note   CSN: [id number] Arrival date & time: 12/29/23  1753     Patient presents with: Sore Throat   Chelsey Walker is a 43 y.o. female.   This is a 43 year old female presenting emergency department with evaluation of sore throat, generalized malaise.  Symptoms started last night, reports painful swallowing, but able to do so.  No angioedema tongue swelling lip swelling.  No muffled voice.  Went to PCP and sent here because had a leukocytosis.  Not having headache.  No nausea no vomiting.   Sore Throat       Prior to Admission medications   Medication Sig Start Date End Date Taking? Authorizing Provider  amoxicillin -clavulanate (AUGMENTIN ) 875-125 MG tablet Take 1 tablet by mouth every 12 (twelve) hours. 12/29/23  Yes Neysa Caron PARAS, DO  cetirizine (ZYRTEC) 5 MG tablet Take 5 mg by mouth daily.    [provider]  EUTHYROX 150 MCG tablet Take 150 mcg by mouth daily. 08/30/20   [provider]  hydrochlorothiazide (HYDRODIURIL) 25 MG tablet Take 0.5 tablets by mouth every morning.    [provider]  methylPREDNISolone  (MEDROL  DOSEPAK) 4 MG TBPK tablet Take as directed Patient not taking: Reported on 05/18/2021 03/26/21   Gershon Donnice SAUNDERS, DPM  Multiple Vitamins-Minerals (MULTIVITAMIN WITH MINERALS) tablet Take 1 tablet by mouth daily.    [provider]  norgestrel-ethinyl estradiol (LO/OVRAL,CRYSELLE) 0.3-30 MG-MCG tablet Take 1 tablet by mouth daily.    [provider]  SAXENDA 18 MG/3ML SOPN Inject 2.4 mg into the skin daily. For weight loss 08/06/20   [provider]  SRONYX 0.1-20 MG-MCG tablet Take 1 tablet by mouth daily. Patient not taking: Reported on 05/18/2021 07/17/20   [provider]    Allergies: Patient has no known allergies.    Review of Systems  Updated Vital Signs BP 115/84   Pulse 73   Temp 100.3 F (37.9 C) (Oral)   Resp 18    SpO2 94%   Physical Exam Vitals and nursing note reviewed.  Constitutional:      General: She is not in acute distress.    Appearance: She is not toxic-appearing.  HENT:     Head: Normocephalic.     Mouth/Throat:     Pharynx: Uvula midline. Pharyngeal swelling present.     Tonsils: Tonsillar exudate present.  Cardiovascular:     Rate and Rhythm: Normal rate and regular rhythm.     Heart sounds: Normal heart sounds.  Pulmonary:     Effort: Pulmonary effort is normal.     Breath sounds: Normal breath sounds.  Abdominal:     General: Bowel sounds are normal.     Palpations: Abdomen is soft.  Skin:    General: Skin is warm and dry.  Neurological:     Mental Status: She is alert.     (all labs ordered are listed, but only abnormal results are displayed) Labs Reviewed  COMPREHENSIVE METABOLIC PANEL WITH GFR - Abnormal; Notable for the following components:      Result Value   Sodium 130 (*)    Chloride 94 (*)    CO2 21 (*)    Glucose, Bld 142 (*)    Total Protein 8.8 (*)    All other components within normal limits  CBC WITH DIFFERENTIAL/PLATELET - Abnormal; Notable for the following components:   WBC 31.5 (*)    Platelets 405 (*)    Neutro Abs 29.3 (*)  Abs Immature Granulocytes 0.37 (*)    All other components within normal limits  GROUP A STREP BY PCR  LACTIC ACID, PLASMA  HCG, SERUM, QUALITATIVE    EKG: None  Radiology: CT Soft Tissue Neck W Contrast Result Date: 12/29/2023 CLINICAL DATA:  Initial evaluation for acute soft tissue infection. Sore throat. EXAM: CT NECK WITH CONTRAST TECHNIQUE: Multidetector CT imaging of the neck was performed using the standard protocol following the bolus administration of intravenous contrast. RADIATION DOSE REDUCTION: This exam was performed according to the departmental dose-optimization program which includes automated exposure control, adjustment of the mA and/or kV according to patient size and/or use of iterative  reconstruction technique. CONTRAST:  75mL OMNIPAQUE  IOHEXOL  300 MG/ML  SOLN COMPARISON:  None Available. FINDINGS: Pharynx and larynx: Oral cavity within normal limits. Palatine tonsils are enlarged and hyperenhancing, suggestive of acute tonsillitis. No discrete tonsillar or peritonsillar abscess. Adenoidal soft tissues are prominent as well. No retropharyngeal collection. Mild mucosal edema within the left pharynx, suggesting a degree of associated pharyngitis. Epiglottis within normal limits. Hypopharynx and supraglottic larynx within normal limits. Negative glottis. Subglottic airway clear. Salivary glands: Salivary glands including the parotid and submandibular glands are within normal limits. Thyroid : Thyroid  is hypoplastic and/or absent. Lymph nodes: Mildly prominent upper cervical lymph nodes measure at the upper limits of normal at 1 cm short axis at level 2, presumably reactive. Vascular: Normal intravascular enhancement seen within the neck. Limited intracranial: Unremarkable. Visualized orbits: Unremarkable. Mastoids and visualized paranasal sinuses: Paranasal sinuses are clear. Visualized mastoids and middle ear cavities are well pneumatized and free of fluid. Skeleton: No worrisome osseous lesions. Mild spondylosis noted at C5-6. Upper chest: No other acute finding. Other: None. IMPRESSION: 1. Findings consistent with acute tonsillitis/pharyngitis. No discrete tonsillar or peritonsillar abscess. 2. Mildly prominent upper cervical lymph nodes, presumably reactive. Electronically Signed   By: Morene Hoard M.D.   On: 12/29/2023 23:39     Procedures   Medications Ordered in the ED  acetaminophen  (TYLENOL ) tablet 650 mg (650 mg Oral Given 12/29/23 2304)  lactated ringers  bolus 1,000 mL (1,000 mLs Intravenous New Bag/Given 12/29/23 2308-01-24)  iohexol  (OMNIPAQUE ) 300 MG/ML solution 75 mL (75 mLs Intravenous Contrast Given 12/29/23 2242)  Ampicillin-Sulbactam (UNASYN) 3 g in sodium chloride  0.9 %  100 mL IVPB (0 g Intravenous Stopped 12/29/23 2349)    Clinical Course as of 12/29/23 2355  Thu Dec 29, 2023  2241 Lactic Acid, Venous: 0.8 [TY]  01-23-2241 Sodium(!): 130 Recieiving IVFs likely hypovolemia 2/2 deceased oral intake.  [TY]  01-24-47 CT Soft Tissue Neck W Contrast IMPRESSION: 1. Findings consistent with acute tonsillitis/pharyngitis. No discrete tonsillar or peritonsillar abscess. 2. Mildly prominent upper cervical lymph nodes, presumably reactive.   Electronically Signed   By: Morene Hoard M.D.   On: 12/29/2023 23:39   [TY]    Clinical Course User Index [TY] Neysa Caron PARAS, DO                                 Medical Decision Making 43 year old female presenting emergency department with sore throat.  History of obesity, seizures, hyperparathyroid, PCOS presenting emergency department with sore throat.  Borderline febrile, tachycardic, but hemodynamically stable.  Clinically is nontoxic-appearing, moving her neck freely.  Her uvula appears to be midline and does seem to be handling secretions.  Initial CBC with a large leukocytosis with left shift.  Covered empirically with Zosyn.  However subsequent lactate  not elevated.  Has some mildly low sodium, receiving IV fluids.  Normal kidney function.  Pregnancy test negative.  Awaiting CT scan evaluate for deep space infection.  She was tested for strep at her PCP and was told it was negative, repeat strep test sent by PCR also not detected.    CT scan resulted prior to handoff.  No deep space infection.  Clinically is well-appearing and handling secretions.  Lactate is normal.  Heart rate normalized with fluids which I suspect is more the cause of her tachycardia than systemic infection.  Strep test was negative.  Does have a high leukocytosis, encouraged her to follow-up with her primary doctor to trend.  Out of abundance of caution we will cover with Augmentin ..  Discharged in stable condition  Amount and/or Complexity of  Data Reviewed Labs: ordered. Decision-making details documented in ED Course. Radiology: ordered. Decision-making details documented in ED Course.  Risk OTC drugs. Prescription drug management.      Final diagnoses:  Pharyngitis, unspecified etiology    ED Discharge Orders          Ordered    amoxicillin -clavulanate (AUGMENTIN ) 875-125 MG tablet  Every 12 hours        12/29/23 2354               Neysa Caron PARAS, DO 12/29/23 2343    Neysa Caron PARAS, DO 12/29/23 2355

## 2023-12-30 NOTE — ED Notes (Signed)
 Reviewed discharge instructions, medications, and home care with pt. Pt verbalized understanding and had no further questions. Pt exited ED without complications.
